# Patient Record
Sex: Male | Born: 1945
Health system: Southern US, Community
[De-identification: ages and names within clinical notes are randomized; demographics above are authoritative.]

## PROBLEM LIST (undated history)

## (undated) DIAGNOSIS — J449 Chronic obstructive pulmonary disease, unspecified: Secondary | ICD-10-CM

## (undated) DIAGNOSIS — J439 Emphysema, unspecified: Secondary | ICD-10-CM

## (undated) HISTORY — DX: Chronic obstructive pulmonary disease, unspecified: J44.9

## (undated) HISTORY — DX: Emphysema, unspecified: J43.9

## (undated) HISTORY — PX: SHOULDER SURGERY: SHX246

---

## 2001-08-29 ENCOUNTER — Encounter: Admission: RE | Admit: 2001-08-29 | Discharge: 2001-08-29 | Payer: Self-pay | Admitting: Neurosurgery

## 2001-08-29 ENCOUNTER — Encounter: Payer: Self-pay | Admitting: Neurosurgery

## 2001-09-12 ENCOUNTER — Encounter: Payer: Self-pay | Admitting: Neurosurgery

## 2001-09-12 ENCOUNTER — Encounter: Admission: RE | Admit: 2001-09-12 | Discharge: 2001-09-12 | Payer: Self-pay | Admitting: Neurosurgery

## 2001-09-26 ENCOUNTER — Encounter: Payer: Self-pay | Admitting: Neurosurgery

## 2001-09-26 ENCOUNTER — Encounter: Admission: RE | Admit: 2001-09-26 | Discharge: 2001-09-26 | Payer: Self-pay | Admitting: Neurosurgery

## 2004-09-30 ENCOUNTER — Ambulatory Visit: Payer: Self-pay | Admitting: Cardiology

## 2004-10-02 ENCOUNTER — Inpatient Hospital Stay (HOSPITAL_COMMUNITY): Admission: AD | Admit: 2004-10-02 | Discharge: 2004-10-02 | Payer: Self-pay | Admitting: Cardiology

## 2004-10-02 ENCOUNTER — Ambulatory Visit: Payer: Self-pay | Admitting: Internal Medicine

## 2004-10-15 ENCOUNTER — Ambulatory Visit: Payer: Self-pay | Admitting: Internal Medicine

## 2004-12-11 ENCOUNTER — Ambulatory Visit: Payer: Self-pay | Admitting: *Deleted

## 2004-12-25 ENCOUNTER — Ambulatory Visit: Payer: Self-pay | Admitting: *Deleted

## 2007-12-26 ENCOUNTER — Ambulatory Visit (HOSPITAL_COMMUNITY): Admission: RE | Admit: 2007-12-26 | Discharge: 2007-12-26 | Payer: Self-pay | Admitting: Orthopedic Surgery

## 2010-06-16 NOTE — Op Note (Signed)
NAMEAZAREL, BANNER            ACCOUNT NO.:  [id number]   MEDICAL RECORD NO.:  [id number]          PATIENT TYPE:  AMB   LOCATION:  SDS                          FACILITY:  [facility]   PHYSICIAN:  Rodney A. Mortenson, M.D.DATE OF BIRTH:  [birth year]   DATE OF PROCEDURE:  DATE OF DISCHARGE:                               OPERATIVE REPORT   JUSTIFICATION:  A 65 year old male with long history of right shoulder  pain, had an MRI and it shows advanced degenerative arthrosis of the  right AC joint with inferior projecting bone spurs and a type 2 curved  anterior acromion causing severe impingement.  There is partial tear of  the rotator cuff, no full-thickness tear was noted.  The patient now  admitted for an arthroscopic evaluation of shoulder and treatment.  Complications discussed preoperatively.  All questions answered and  encouraged and the patient wished to proceed.   Justification outpatient surgical and minimal morbidity.   PREOPERATIVE DIAGNOSES:  Partial articular surface tear, rotator cuff  right shoulder; impingement syndrome with inferior bone spurs of  acromion and advanced arthritis, right acromioclavicular joint.   POSTOPERATIVE DIAGNOSES:  Partial articular surface tear, rotator cuff  right shoulder; impingement syndrome with inferior bone spurs of  acromion and advanced arthritis, right acromioclavicular joint.   OPERATION:  Arthroscopic evaluation, right shoulder; debridement  articular surface partial tear, rotator cuff; subacromial decompression  and distal clavicle resection through the arthroscope.   SURGEON:  Lenard Galloway. Chaney Malling, MD   ASSISTANT:  Oris Drone. Petrarca, PA-C   ANESTHESIA:  General.   DESCRIPTION OF PROCEDURE:  The patient was placed on the table in supine  position.  After satisfactory general anesthesia, the patient was placed  in semi-sitting position.  The right upper extremity and shoulder was  prepped with DuraPrep and draped out in the usual  manner.  The  arthroscope was introduced into the posterior portal and anterior  operative portal was made.  The glenohumeral joint was visualized.  The  articular cartilage over the humeral head and glenoid appeared normal as  did the entire circumference of the labrum.  The biceps was intact.  The  inferior surface of the rotator cuff was frayed and torn, and through an  anterior portal, ArthroCare wand was inserted and frayed and torn  portions of the rotator cuff were debrided.  Once this was accomplished  to my satisfaction, the arthroscope was then placed in the subacromial  space.  No full-thickness tear was seen.  A partial bursectomy was then  done.  The inferior bone spurs on the acromion.  Through a lateral  portal, a bur was inserted.  A fairly generous acromioplasty was done  and inferior bone spurs were removed.  Throughout the ArthroCare wand  was used to control bleeding and also to debride soft tissue to make  access to the bony surfaces more radically adequate.  The [facility] joint was  then identified with an 18-gauge spinal needle through the [facility] joint.  The undersurface of the clavicle was then debrided with the high-speed  bur.  Through an anterior portal, the bur was inserted at the  level of  the [facility] joint and the acromion of the [facility] joint was debrided and then the  bur was placed in the [facility] joint itself.  Distal end of the clavicle was  then resected.  This was done very carefully and meticulously.  All bone  spurs were removed.  Excellent decompression was achieved.  Good  hemostasis was achieved throughout the procedure.  I was very pleased  with final outcome and decompression that was accomplished.  Drains  none.  Complications none.  Two sutures were placed in 2 portals and  sterile dressing applied.  The patient returned to the recovery room in  excellent condition.  Technically this went extremely well.   DISPOSITION:  1. He may wear a sling and after 10 days does not  need a sling.  2. Percocet for pain.  3. To my office in [place] in a week on Friday.  4. Usual postop instructions.      Rodney A. Chaney Malling, M.D.  Electronically Signed     RAM/MEDQ  D:  12/26/2007  T:  12/27/2007  Job:  [id number]   cc:   Donzetta Sprung

## 2010-06-19 NOTE — Discharge Summary (Signed)
[name], [name]            ACCOUNT NO.:  [id number]   MEDICAL RECORD NO.:  [id number]          PATIENT TYPE:  INP   LOCATION:  6524                         FACILITY:  [facility]   PHYSICIAN:  Arvilla Meres, M.D. LHCDATE OF BIRTH:  1945-04-24   DATE OF ADMISSION:  10/02/2004  DATE OF DISCHARGE:  10/02/2004                                 DISCHARGE SUMMARY   PROCEDURES:  1.  Cardiac catheterization.  2.  Coronary arteriogram.  3.  Left ventriculogram.   DISCHARGE DIAGNOSES:  1.  Chest pain, status post catheterization this admission showing minimal      nonobstructive coronary artery disease and a muscle bridge in the mid      LAD of approximately 30% that was not felt to be flow limiting.  2.  Preserved left ventricular function with an EF of 60% and possible very      mild anterior hypokinesis.  3.  Intolerance or allergy to ULTRAM, NEURONTIN, AND LIPITOR.  4.  Remote history of tobacco use.  5.  Hypertension.  6.  Dyslipidemia.  7.  History of peripheral vascular disease, status post left femoral bypass      secondary to Leriche syndrome  8.  Emphysema.   HOSPITAL COURSE:  [name] is a 65 year old male with no known history of  coronary artery disease.  He quit smoking several years ago. He had an  ischemic workup in 1994 with a negative Cardiolite.  He went to [facility], on September 29, 2004, for chest pain.  He was evaluated there by Dr.  Reuel Boom and referred to cardiology.   [name] was evaluated there by Dr. Andee Lineman.  Dr. Andee Lineman was concerned  because of multiple cardiac risk factors, although his chest pain was  atypical, he felt that catheterization was indicated.  He was transferred to  [facility] for this on October 02, 2004.   The cardiac catheterization showed nonobstructive coronary artery disease  with a 20% circumflex, a 30% RCA, and a 30% stenosis in the LAD created by  muscle bridge.  Dr. Gala Romney evaluated the films and felt  that this was not  flow limiting.  He had potential for a mild anterior hypokinesis but his EF  was 60% with no other wall motion abnormalities noted.   [name] was done as an arm case because of his peripheral vascular  disease.  Post cath, his site was without ecchymosis, oozing, or hematoma.  Dr. Gala Romney evaluated [name] and considered him stable for discharge,  on October 02, 2004, with outpatient followup arranged.   DISCHARGE INSTRUCTIONS:  1.  His activity level is to be per the cath discharge sheet.  2.  A sling will be used to help him remember to keep his arm still as the      right brachial was used and he is right-handed.  3.  He is to stick to a low-fat diet.  4.  He is to follow up with Suszanne Conners. Julious Oka. for Dr. Andee Lineman on      October 15, 2004 at 10:45 a.m.  5.  He is  to follow up with Dr. Reuel Boom as needed.   DISCHARGE MEDICATIONS:  1.  Hydrocodone/Lorcet as prior to admission.  2.  Aspirin 81 mg every day.  3.  Micardis HCT 80/25 mg every day.  4.  Trazodone as prior to admission.      Theodore Demark, P.A. [facility]      Arvilla Meres, M.D. [facility]  Electronically Signed    RB/MEDQ  D:  10/02/2004  T:  10/03/2004  Job:  [id number]   cc:   Reuel Boom, Dr.  [facility]  [place], [place]

## 2010-06-19 NOTE — Cardiovascular Report (Signed)
NAMEBENTLY, WYSS NO.:  [id number]   MEDICAL RECORD NO.:  [id number]          PATIENT TYPE:  INP   LOCATION:  6524                         [facility]AC[facility]  [facility]   PHYSICIAN:  Arvilla Meres, M.D. [facility] OF BIRTH:  12-15-1945   DATE OF PROCEDURE:  10/02/2004  DATE OF DISCHARGE:                              CARDIAC CATHETERIZATION   PRIMARY CARE PHYSICIAN:  Dr. Donzetta Sprung in [place], [place].   CARDIOLOGIST:  Dr. Andee Lineman.   PATIENT IDENTIFICATION:  Mr. Micucci is a 65 year old male with a history of  peripheral vascular disease status post aortobifemoral grafting as well as  hypertension and hyperlipidemia. He was admitted to [facility] with  some atypical chest pain as well as palpitations. He ruled out for  myocardial infarction. He underwent adenosine Cardiolite which showed  multiple perfusion defects in the inferior wall as well as the anterior  septum and thus was referred for cardiac catheterization. Given his previous  aortobifemoral bypass and the complications he has had with it, it was  requested that he be done through a right brachial approach.   PROCEDURES PERFORMED:  1.  Selective coronary angiography.  2.  Left heart catheterization.  3.  Left ventriculogram.   DESCRIPTION OF PROCEDURE:  The risks and benefits of the catheterization  were explained to Mr. Emma; consent was signed and placed on the chart. A  6-French arterial sheath was placed in the right brachial artery after a  normal Allen's test. Once the sheath was inserted, the patient was given  3,000 of IV heparin, and the sheath was sewn in. A Castillo II catheter was  used to image the left coronary system. The right coronary artery had an  anterior takeoff, and we used the Spry I catheter to engage. All  catheter exchanges were made over a long wire. There were no apparent  complications. At the end the procedure, the patient was transferred in the  holding area  for removal of his sheath.   FINDINGS:  Central aortic pressure is 105/64 with a mean of 81. LV pressure  was 102/0 with an LVEDP of 5. There was no gradient on aortic valve  pullback.   Left main was normal.   LAD was a long vessel that coursed to the apex. It gave off a large first  diagonal and a small second diagonal. There was evidence of muscle bridge in  the mid to distal LAD just after the takeoff of the second diagonal with  about 30-40% luminal narrowing during systole. There is no flow-limiting  disease there. Distal flow was not compromised.   The left circumflex is a moderate size vessel. It gave off a small ramus, a  small OM-1 and a small OM-2, and a large OM-3. There is a 20% tubular  stenosis in the proximal portion of the OM-3.   Right coronary artery was a large dominant vessel that gave off a large PDA  and two small to moderate-sized PLs. There is a tubular irregularity in the  proximal portion and 30% tubular lesion in the mid portion.   Left ventriculogram done in RAO  approach showed an EF of 60%. There was a  question  of perhaps very mild anterior hypokinesis which may have been  related to some ectopy.   ASSESSMENT:  1.  Minimal nonobstructive coronary disease.  2.  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  3.  Normal left ventricular function with a question of very mild anterior      hypokinesis possibly related to ectopy.   PLAN:  1.  Medical therapy for his coronary disease.  2.  He can likely be discharged home tonight if his catheter site is stable.      Arvilla Meres, M.D. [facility]  Electronically Signed     DB/MEDQ  D:  10/02/2004  T:  10/03/2004  Job:  [id number]   cc:   Donzetta Sprung  [address]  [place]  [place] [address]  Fax: [phone]   Learta Codding, M.D. [facility]  [address]  [address]  [place]  [place] [address]

## 2010-11-03 LAB — COMPREHENSIVE METABOLIC PANEL
ALT: 27
AST: 26
Albumin: 4.1
Alkaline Phosphatase: 84
BUN: 12
CO2: 28
Calcium: 9.5
Chloride: 102
Creatinine, Ser: 0.81
GFR calc Af Amer: >60
GFR calc non Af Amer: >60
Glucose, Bld: 111 — ABNORMAL HIGH
Potassium: 4.3
Sodium: 138
Total Bilirubin: 0.8
Total Protein: 7.4

## 2010-11-03 LAB — CBC
HCT: 45.1
Hemoglobin: 15.3
MCHC: 33.8
MCHC: 33.9
MCV: 98.1
Platelets: 260
RBC: 4.59
RDW: 12.6
RDW: 12.8
WBC: 5.2

## 2010-11-03 LAB — URINALYSIS, ROUTINE W REFLEX MICROSCOPIC
Bilirubin Urine: NEGATIVE
Ketones, ur: NEGATIVE
Nitrite: NEGATIVE
Urobilinogen, UA: 1

## 2010-11-03 LAB — DIFFERENTIAL
Basophils Absolute: 0.1
Basophils Relative: 1
Neutro Abs: 1.7
Neutrophils Relative %: 35 — ABNORMAL LOW

## 2010-11-03 LAB — PROTIME-INR
INR: 1
Prothrombin Time: 13.2

## 2010-11-03 LAB — APTT: aPTT: 32

## 2012-03-28 DIAGNOSIS — E782 Mixed hyperlipidemia: Secondary | ICD-10-CM | POA: Diagnosis not present

## 2012-03-28 DIAGNOSIS — N400 Enlarged prostate without lower urinary tract symptoms: Secondary | ICD-10-CM | POA: Diagnosis not present

## 2012-03-28 DIAGNOSIS — I10 Essential (primary) hypertension: Secondary | ICD-10-CM | POA: Diagnosis not present

## 2012-04-04 DIAGNOSIS — I10 Essential (primary) hypertension: Secondary | ICD-10-CM | POA: Diagnosis not present

## 2012-04-04 DIAGNOSIS — M545 Low back pain: Secondary | ICD-10-CM | POA: Diagnosis not present

## 2012-04-04 DIAGNOSIS — J449 Chronic obstructive pulmonary disease, unspecified: Secondary | ICD-10-CM | POA: Diagnosis not present

## 2012-04-04 DIAGNOSIS — C61 Malignant neoplasm of prostate: Secondary | ICD-10-CM | POA: Diagnosis not present

## 2012-04-04 DIAGNOSIS — I739 Peripheral vascular disease, unspecified: Secondary | ICD-10-CM | POA: Diagnosis not present

## 2012-04-04 DIAGNOSIS — J309 Allergic rhinitis, unspecified: Secondary | ICD-10-CM | POA: Diagnosis not present

## 2012-04-04 DIAGNOSIS — M1990 Unspecified osteoarthritis, unspecified site: Secondary | ICD-10-CM | POA: Diagnosis not present

## 2012-04-04 DIAGNOSIS — E782 Mixed hyperlipidemia: Secondary | ICD-10-CM | POA: Diagnosis not present

## 2012-05-11 DIAGNOSIS — M1990 Unspecified osteoarthritis, unspecified site: Secondary | ICD-10-CM | POA: Diagnosis not present

## 2012-05-11 DIAGNOSIS — I739 Peripheral vascular disease, unspecified: Secondary | ICD-10-CM | POA: Diagnosis not present

## 2012-05-11 DIAGNOSIS — J449 Chronic obstructive pulmonary disease, unspecified: Secondary | ICD-10-CM | POA: Diagnosis not present

## 2012-05-11 DIAGNOSIS — M545 Low back pain: Secondary | ICD-10-CM | POA: Diagnosis not present

## 2012-05-11 DIAGNOSIS — I10 Essential (primary) hypertension: Secondary | ICD-10-CM | POA: Diagnosis not present

## 2012-05-11 DIAGNOSIS — E782 Mixed hyperlipidemia: Secondary | ICD-10-CM | POA: Diagnosis not present

## 2012-05-11 DIAGNOSIS — J309 Allergic rhinitis, unspecified: Secondary | ICD-10-CM | POA: Diagnosis not present

## 2012-05-11 DIAGNOSIS — C61 Malignant neoplasm of prostate: Secondary | ICD-10-CM | POA: Diagnosis not present

## 2012-09-12 DIAGNOSIS — C61 Malignant neoplasm of prostate: Secondary | ICD-10-CM | POA: Diagnosis not present

## 2012-09-12 DIAGNOSIS — E782 Mixed hyperlipidemia: Secondary | ICD-10-CM | POA: Diagnosis not present

## 2012-09-12 DIAGNOSIS — E039 Hypothyroidism, unspecified: Secondary | ICD-10-CM | POA: Diagnosis not present

## 2012-09-19 DIAGNOSIS — E782 Mixed hyperlipidemia: Secondary | ICD-10-CM | POA: Diagnosis not present

## 2012-09-19 DIAGNOSIS — I739 Peripheral vascular disease, unspecified: Secondary | ICD-10-CM | POA: Diagnosis not present

## 2012-09-19 DIAGNOSIS — I10 Essential (primary) hypertension: Secondary | ICD-10-CM | POA: Diagnosis not present

## 2012-09-19 DIAGNOSIS — J309 Allergic rhinitis, unspecified: Secondary | ICD-10-CM | POA: Diagnosis not present

## 2012-09-19 DIAGNOSIS — M545 Low back pain: Secondary | ICD-10-CM | POA: Diagnosis not present

## 2012-09-19 DIAGNOSIS — M1990 Unspecified osteoarthritis, unspecified site: Secondary | ICD-10-CM | POA: Diagnosis not present

## 2012-09-19 DIAGNOSIS — J449 Chronic obstructive pulmonary disease, unspecified: Secondary | ICD-10-CM | POA: Diagnosis not present

## 2012-09-19 DIAGNOSIS — C61 Malignant neoplasm of prostate: Secondary | ICD-10-CM | POA: Diagnosis not present

## 2012-11-07 DIAGNOSIS — I739 Peripheral vascular disease, unspecified: Secondary | ICD-10-CM | POA: Diagnosis not present

## 2012-11-07 DIAGNOSIS — M545 Low back pain: Secondary | ICD-10-CM | POA: Diagnosis not present

## 2012-11-07 DIAGNOSIS — C61 Malignant neoplasm of prostate: Secondary | ICD-10-CM | POA: Diagnosis not present

## 2012-11-07 DIAGNOSIS — J309 Allergic rhinitis, unspecified: Secondary | ICD-10-CM | POA: Diagnosis not present

## 2012-11-07 DIAGNOSIS — E782 Mixed hyperlipidemia: Secondary | ICD-10-CM | POA: Diagnosis not present

## 2012-11-07 DIAGNOSIS — I10 Essential (primary) hypertension: Secondary | ICD-10-CM | POA: Diagnosis not present

## 2012-11-07 DIAGNOSIS — Z23 Encounter for immunization: Secondary | ICD-10-CM | POA: Diagnosis not present

## 2012-11-07 DIAGNOSIS — M1990 Unspecified osteoarthritis, unspecified site: Secondary | ICD-10-CM | POA: Diagnosis not present

## 2012-11-24 DIAGNOSIS — J449 Chronic obstructive pulmonary disease, unspecified: Secondary | ICD-10-CM | POA: Diagnosis not present

## 2012-11-24 DIAGNOSIS — J209 Acute bronchitis, unspecified: Secondary | ICD-10-CM | POA: Diagnosis not present

## 2012-11-24 DIAGNOSIS — R05 Cough: Secondary | ICD-10-CM | POA: Diagnosis not present

## 2012-11-24 DIAGNOSIS — R0609 Other forms of dyspnea: Secondary | ICD-10-CM | POA: Diagnosis not present

## 2013-01-15 DIAGNOSIS — I739 Peripheral vascular disease, unspecified: Secondary | ICD-10-CM | POA: Diagnosis present

## 2013-01-15 DIAGNOSIS — Z9981 Dependence on supplemental oxygen: Secondary | ICD-10-CM | POA: Diagnosis not present

## 2013-01-15 DIAGNOSIS — G4733 Obstructive sleep apnea (adult) (pediatric): Secondary | ICD-10-CM | POA: Diagnosis present

## 2013-01-15 DIAGNOSIS — R0602 Shortness of breath: Secondary | ICD-10-CM | POA: Diagnosis not present

## 2013-01-15 DIAGNOSIS — J9600 Acute respiratory failure, unspecified whether with hypoxia or hypercapnia: Secondary | ICD-10-CM | POA: Diagnosis not present

## 2013-01-15 DIAGNOSIS — Z79899 Other long term (current) drug therapy: Secondary | ICD-10-CM | POA: Diagnosis not present

## 2013-01-15 DIAGNOSIS — G8929 Other chronic pain: Secondary | ICD-10-CM | POA: Diagnosis present

## 2013-01-15 DIAGNOSIS — G609 Hereditary and idiopathic neuropathy, unspecified: Secondary | ICD-10-CM | POA: Diagnosis present

## 2013-01-15 DIAGNOSIS — C61 Malignant neoplasm of prostate: Secondary | ICD-10-CM | POA: Diagnosis present

## 2013-01-15 DIAGNOSIS — R918 Other nonspecific abnormal finding of lung field: Secondary | ICD-10-CM | POA: Diagnosis not present

## 2013-01-15 DIAGNOSIS — J441 Chronic obstructive pulmonary disease with (acute) exacerbation: Secondary | ICD-10-CM | POA: Diagnosis not present

## 2013-01-15 DIAGNOSIS — Z7982 Long term (current) use of aspirin: Secondary | ICD-10-CM | POA: Diagnosis not present

## 2013-01-15 DIAGNOSIS — Z87891 Personal history of nicotine dependence: Secondary | ICD-10-CM | POA: Diagnosis not present

## 2013-01-15 DIAGNOSIS — Z888 Allergy status to other drugs, medicaments and biological substances status: Secondary | ICD-10-CM | POA: Diagnosis not present

## 2013-01-15 DIAGNOSIS — R0609 Other forms of dyspnea: Secondary | ICD-10-CM | POA: Diagnosis not present

## 2013-01-15 DIAGNOSIS — J9620 Acute and chronic respiratory failure, unspecified whether with hypoxia or hypercapnia: Secondary | ICD-10-CM | POA: Diagnosis present

## 2013-01-15 DIAGNOSIS — R0902 Hypoxemia: Secondary | ICD-10-CM | POA: Diagnosis not present

## 2013-01-15 DIAGNOSIS — F329 Major depressive disorder, single episode, unspecified: Secondary | ICD-10-CM | POA: Diagnosis present

## 2013-01-15 DIAGNOSIS — F410 Panic disorder [episodic paroxysmal anxiety] without agoraphobia: Secondary | ICD-10-CM | POA: Diagnosis present

## 2013-02-15 DIAGNOSIS — M545 Low back pain, unspecified: Secondary | ICD-10-CM | POA: Diagnosis not present

## 2013-02-15 DIAGNOSIS — E782 Mixed hyperlipidemia: Secondary | ICD-10-CM | POA: Diagnosis not present

## 2013-02-15 DIAGNOSIS — M1990 Unspecified osteoarthritis, unspecified site: Secondary | ICD-10-CM | POA: Diagnosis not present

## 2013-02-15 DIAGNOSIS — C61 Malignant neoplasm of prostate: Secondary | ICD-10-CM | POA: Diagnosis not present

## 2013-02-15 DIAGNOSIS — I739 Peripheral vascular disease, unspecified: Secondary | ICD-10-CM | POA: Diagnosis not present

## 2013-02-15 DIAGNOSIS — Z1331 Encounter for screening for depression: Secondary | ICD-10-CM | POA: Diagnosis not present

## 2013-02-15 DIAGNOSIS — J309 Allergic rhinitis, unspecified: Secondary | ICD-10-CM | POA: Diagnosis not present

## 2013-02-15 DIAGNOSIS — I10 Essential (primary) hypertension: Secondary | ICD-10-CM | POA: Diagnosis not present

## 2013-02-21 DIAGNOSIS — E782 Mixed hyperlipidemia: Secondary | ICD-10-CM | POA: Diagnosis not present

## 2013-02-21 DIAGNOSIS — M1990 Unspecified osteoarthritis, unspecified site: Secondary | ICD-10-CM | POA: Diagnosis not present

## 2013-02-21 DIAGNOSIS — M545 Low back pain, unspecified: Secondary | ICD-10-CM | POA: Diagnosis not present

## 2013-02-21 DIAGNOSIS — J449 Chronic obstructive pulmonary disease, unspecified: Secondary | ICD-10-CM | POA: Diagnosis not present

## 2013-02-21 DIAGNOSIS — J309 Allergic rhinitis, unspecified: Secondary | ICD-10-CM | POA: Diagnosis not present

## 2013-02-21 DIAGNOSIS — I10 Essential (primary) hypertension: Secondary | ICD-10-CM | POA: Diagnosis not present

## 2013-02-21 DIAGNOSIS — C61 Malignant neoplasm of prostate: Secondary | ICD-10-CM | POA: Diagnosis not present

## 2013-02-21 DIAGNOSIS — I739 Peripheral vascular disease, unspecified: Secondary | ICD-10-CM | POA: Diagnosis not present

## 2013-05-09 DIAGNOSIS — J209 Acute bronchitis, unspecified: Secondary | ICD-10-CM | POA: Diagnosis not present

## 2013-05-09 DIAGNOSIS — J449 Chronic obstructive pulmonary disease, unspecified: Secondary | ICD-10-CM | POA: Diagnosis not present

## 2013-05-14 DIAGNOSIS — I739 Peripheral vascular disease, unspecified: Secondary | ICD-10-CM | POA: Diagnosis not present

## 2013-05-14 DIAGNOSIS — G619 Inflammatory polyneuropathy, unspecified: Secondary | ICD-10-CM | POA: Diagnosis not present

## 2013-05-14 DIAGNOSIS — E039 Hypothyroidism, unspecified: Secondary | ICD-10-CM | POA: Diagnosis not present

## 2013-05-14 DIAGNOSIS — I10 Essential (primary) hypertension: Secondary | ICD-10-CM | POA: Diagnosis not present

## 2013-05-14 DIAGNOSIS — C61 Malignant neoplasm of prostate: Secondary | ICD-10-CM | POA: Diagnosis not present

## 2013-05-14 DIAGNOSIS — M1990 Unspecified osteoarthritis, unspecified site: Secondary | ICD-10-CM | POA: Diagnosis not present

## 2013-05-14 DIAGNOSIS — E782 Mixed hyperlipidemia: Secondary | ICD-10-CM | POA: Diagnosis not present

## 2013-05-21 DIAGNOSIS — C61 Malignant neoplasm of prostate: Secondary | ICD-10-CM | POA: Diagnosis not present

## 2013-05-21 DIAGNOSIS — I739 Peripheral vascular disease, unspecified: Secondary | ICD-10-CM | POA: Diagnosis not present

## 2013-05-21 DIAGNOSIS — M1990 Unspecified osteoarthritis, unspecified site: Secondary | ICD-10-CM | POA: Diagnosis not present

## 2013-05-21 DIAGNOSIS — J309 Allergic rhinitis, unspecified: Secondary | ICD-10-CM | POA: Diagnosis not present

## 2013-05-21 DIAGNOSIS — I10 Essential (primary) hypertension: Secondary | ICD-10-CM | POA: Diagnosis not present

## 2013-05-21 DIAGNOSIS — J449 Chronic obstructive pulmonary disease, unspecified: Secondary | ICD-10-CM | POA: Diagnosis not present

## 2013-05-21 DIAGNOSIS — M545 Low back pain, unspecified: Secondary | ICD-10-CM | POA: Diagnosis not present

## 2013-05-21 DIAGNOSIS — E782 Mixed hyperlipidemia: Secondary | ICD-10-CM | POA: Diagnosis not present

## 2013-07-20 DIAGNOSIS — J209 Acute bronchitis, unspecified: Secondary | ICD-10-CM | POA: Diagnosis not present

## 2013-07-20 DIAGNOSIS — J019 Acute sinusitis, unspecified: Secondary | ICD-10-CM | POA: Diagnosis not present

## 2013-07-20 DIAGNOSIS — J449 Chronic obstructive pulmonary disease, unspecified: Secondary | ICD-10-CM | POA: Diagnosis not present

## 2013-08-21 DIAGNOSIS — M1990 Unspecified osteoarthritis, unspecified site: Secondary | ICD-10-CM | POA: Diagnosis not present

## 2013-08-21 DIAGNOSIS — I739 Peripheral vascular disease, unspecified: Secondary | ICD-10-CM | POA: Diagnosis not present

## 2013-08-21 DIAGNOSIS — M545 Low back pain, unspecified: Secondary | ICD-10-CM | POA: Diagnosis not present

## 2013-08-21 DIAGNOSIS — J309 Allergic rhinitis, unspecified: Secondary | ICD-10-CM | POA: Diagnosis not present

## 2013-08-21 DIAGNOSIS — C61 Malignant neoplasm of prostate: Secondary | ICD-10-CM | POA: Diagnosis not present

## 2013-08-21 DIAGNOSIS — I10 Essential (primary) hypertension: Secondary | ICD-10-CM | POA: Diagnosis not present

## 2013-08-21 DIAGNOSIS — J449 Chronic obstructive pulmonary disease, unspecified: Secondary | ICD-10-CM | POA: Diagnosis not present

## 2013-08-21 DIAGNOSIS — E782 Mixed hyperlipidemia: Secondary | ICD-10-CM | POA: Diagnosis not present

## 2013-10-01 DIAGNOSIS — M949 Disorder of cartilage, unspecified: Secondary | ICD-10-CM | POA: Diagnosis not present

## 2013-10-01 DIAGNOSIS — M899 Disorder of bone, unspecified: Secondary | ICD-10-CM | POA: Diagnosis not present

## 2013-10-01 DIAGNOSIS — Z13820 Encounter for screening for osteoporosis: Secondary | ICD-10-CM | POA: Diagnosis not present

## 2013-11-20 DIAGNOSIS — E784 Other hyperlipidemia: Secondary | ICD-10-CM | POA: Diagnosis not present

## 2013-11-20 DIAGNOSIS — I739 Peripheral vascular disease, unspecified: Secondary | ICD-10-CM | POA: Diagnosis not present

## 2013-11-20 DIAGNOSIS — C61 Malignant neoplasm of prostate: Secondary | ICD-10-CM | POA: Diagnosis not present

## 2013-11-20 DIAGNOSIS — Z23 Encounter for immunization: Secondary | ICD-10-CM | POA: Diagnosis not present

## 2013-11-20 DIAGNOSIS — I10 Essential (primary) hypertension: Secondary | ICD-10-CM | POA: Diagnosis not present

## 2013-11-20 DIAGNOSIS — Z9189 Other specified personal risk factors, not elsewhere classified: Secondary | ICD-10-CM | POA: Diagnosis not present

## 2013-11-20 DIAGNOSIS — E038 Other specified hypothyroidism: Secondary | ICD-10-CM | POA: Diagnosis not present

## 2013-11-20 DIAGNOSIS — J301 Allergic rhinitis due to pollen: Secondary | ICD-10-CM | POA: Diagnosis not present

## 2014-03-01 DIAGNOSIS — J13 Pneumonia due to Streptococcus pneumoniae: Secondary | ICD-10-CM | POA: Diagnosis not present

## 2014-03-01 DIAGNOSIS — R509 Fever, unspecified: Secondary | ICD-10-CM | POA: Diagnosis not present

## 2014-03-01 DIAGNOSIS — J449 Chronic obstructive pulmonary disease, unspecified: Secondary | ICD-10-CM | POA: Diagnosis not present

## 2014-03-01 DIAGNOSIS — R05 Cough: Secondary | ICD-10-CM | POA: Diagnosis not present

## 2014-03-19 DIAGNOSIS — E784 Other hyperlipidemia: Secondary | ICD-10-CM | POA: Diagnosis not present

## 2014-03-19 DIAGNOSIS — R05 Cough: Secondary | ICD-10-CM | POA: Diagnosis not present

## 2014-03-19 DIAGNOSIS — N400 Enlarged prostate without lower urinary tract symptoms: Secondary | ICD-10-CM | POA: Diagnosis not present

## 2014-03-19 DIAGNOSIS — R0600 Dyspnea, unspecified: Secondary | ICD-10-CM | POA: Diagnosis not present

## 2014-03-19 DIAGNOSIS — I10 Essential (primary) hypertension: Secondary | ICD-10-CM | POA: Diagnosis not present

## 2014-03-19 DIAGNOSIS — E038 Other specified hypothyroidism: Secondary | ICD-10-CM | POA: Diagnosis not present

## 2014-03-25 DIAGNOSIS — M545 Low back pain: Secondary | ICD-10-CM | POA: Diagnosis not present

## 2014-03-25 DIAGNOSIS — I739 Peripheral vascular disease, unspecified: Secondary | ICD-10-CM | POA: Diagnosis not present

## 2014-03-25 DIAGNOSIS — I10 Essential (primary) hypertension: Secondary | ICD-10-CM | POA: Diagnosis not present

## 2014-03-25 DIAGNOSIS — J449 Chronic obstructive pulmonary disease, unspecified: Secondary | ICD-10-CM | POA: Diagnosis not present

## 2014-03-25 DIAGNOSIS — G6289 Other specified polyneuropathies: Secondary | ICD-10-CM | POA: Diagnosis not present

## 2014-03-25 DIAGNOSIS — Z0001 Encounter for general adult medical examination with abnormal findings: Secondary | ICD-10-CM | POA: Diagnosis not present

## 2014-03-25 DIAGNOSIS — J301 Allergic rhinitis due to pollen: Secondary | ICD-10-CM | POA: Diagnosis not present

## 2014-03-25 DIAGNOSIS — C61 Malignant neoplasm of prostate: Secondary | ICD-10-CM | POA: Diagnosis not present

## 2014-03-25 DIAGNOSIS — Z1389 Encounter for screening for other disorder: Secondary | ICD-10-CM | POA: Diagnosis not present

## 2014-03-25 DIAGNOSIS — E784 Other hyperlipidemia: Secondary | ICD-10-CM | POA: Diagnosis not present

## 2014-03-25 DIAGNOSIS — Z9189 Other specified personal risk factors, not elsewhere classified: Secondary | ICD-10-CM | POA: Diagnosis not present

## 2014-07-04 NOTE — Patient Outreach (Signed)
[facility]) Care Management  07/04/2014  George Andersen November 16, 1945 [id number]   Referral from MD, assigned Theadore Nan, LCSW.  [name]. [place], [place] Management [place] Assistant Phone: [phone] Fax: [phone]

## 2014-07-11 ENCOUNTER — Other Ambulatory Visit: Payer: Self-pay | Admitting: Licensed Clinical Social Worker

## 2014-07-11 NOTE — Patient Outreach (Signed)
  Assessment:  CSW received referral on George Andersen.  CSW completed chart review on client on 07/11/14.  CSW called home phone number of client on 07/11/14.  CSW poke via phone with client on 07/11/14. CSW verified identity of client. CSW introduced self and informed client that Dr. Quillian Quince, primary doctor for client, had referred client to [facility] program on 06/27/14.  Client gave CSW verbal permission to talk with client about current medical needs of client. Client said he takes pain medications as prescribed.  He said he does well walking around his home.  He does not use a cane or walker. He said he eats well and has good appetite.  He said he thinks his weight has remained stable fo rthe past few months. Client has ongoing pain issues. Pain area is in back and legs.  He takes pain medications as prescribed.  CSW and client spoke of client's use of psychotropic medications as prescribed (xanax and celexa). Client said he takes his psychotropic medications as prescribed. CSW discussed with client that client could talk further with Dr. Quillian Quince about counseling support services in [place], [place] for client. CSW informed client regarding 3 counseling support services in [place], [place]:  [facility] agency, [facility], learning disability, and [employer].  Again, CSW encouraged client to speak further with Dr. Quillian Quince regarding counseling support options for client.  CSW and client completed needed [facility] assessments for client.  CSW talked with client about [facility] consent form completion.  Client agreed for [facility] consent form to be mailed to him for his review.  CSW gave client CSW [facility] phone number of [phone] and invited client to call CSW as needed for clinical social work support.  CSW thanked client for phone call on 07/11/14.  Plan: Client to take medications as prescribed and attend scheduled medical appointments. Freda Jackson to mail client [facility] consent form and return envelope for  client review. CSW to call client in two weeks to assess needs of client.  [name].George Andersen MSW, LCSW Licensed Clinical Social Worker [facility] Care Management [phone]

## 2014-07-31 DIAGNOSIS — N400 Enlarged prostate without lower urinary tract symptoms: Secondary | ICD-10-CM | POA: Diagnosis not present

## 2014-08-08 DIAGNOSIS — J9611 Chronic respiratory failure with hypoxia: Secondary | ICD-10-CM | POA: Diagnosis not present

## 2014-08-08 DIAGNOSIS — E038 Other specified hypothyroidism: Secondary | ICD-10-CM | POA: Diagnosis not present

## 2014-08-08 DIAGNOSIS — G6289 Other specified polyneuropathies: Secondary | ICD-10-CM | POA: Diagnosis not present

## 2014-08-08 DIAGNOSIS — N400 Enlarged prostate without lower urinary tract symptoms: Secondary | ICD-10-CM | POA: Diagnosis not present

## 2014-08-08 DIAGNOSIS — I10 Essential (primary) hypertension: Secondary | ICD-10-CM | POA: Diagnosis not present

## 2014-08-08 DIAGNOSIS — M545 Low back pain: Secondary | ICD-10-CM | POA: Diagnosis not present

## 2014-08-08 DIAGNOSIS — C61 Malignant neoplasm of prostate: Secondary | ICD-10-CM | POA: Diagnosis not present

## 2014-08-08 DIAGNOSIS — E784 Other hyperlipidemia: Secondary | ICD-10-CM | POA: Diagnosis not present

## 2014-08-08 DIAGNOSIS — J449 Chronic obstructive pulmonary disease, unspecified: Secondary | ICD-10-CM | POA: Diagnosis not present

## 2014-08-08 DIAGNOSIS — I739 Peripheral vascular disease, unspecified: Secondary | ICD-10-CM | POA: Diagnosis not present

## 2014-09-10 ENCOUNTER — Other Ambulatory Visit: Payer: Self-pay | Admitting: Licensed Clinical Social Worker

## 2014-09-10 NOTE — Patient Outreach (Signed)
Assessment:  CSW completed chart review on client on 09/10/14.  CSW called home phone number of client on 09/10/14. CSW verified identity of client.  CSW spoke via phone with client on 09/10/14. CSW reminded client that since client saw Dr. Quillian Quince as client's primary care doctor, that there was no charge for client to receive [facility] program support. Client has support from his wife.  He is attending scheduled medical appointments. He has his prescribed medications. He said he uses a nebulizer about 4 times daily. He said he feels that nebulizer treatments are helpful to client.  He said he uses two prescribed medications, as needed, in nebulizer treatments.   He said he does not use inhaler. He said he walks well in the home. He uses oxygen continuously via nasal canula at 3 liters.  He said he has been on oxygen for about four years. He said he saw Dr. Quillian Quince about two weeks ago.  He said he has support from his spouse at home.  He said he can do well with activities of daily living. He said sometimes he gets short of breath.  He fatigues easily.  He said he feels that he gets nervous or shaky periodically.  He sometimes gets nervous, short of breath, sweating and cannot breath well.  He is taking prescribed psychotropic medications. He said he takes xanax as prescribed, celexa as prescribed, prednisone as prescribed. He said his medications can be expensive to purchase each month.  CSW spoke with client about counseling agencies in [place], [place].  [place] informed client about [facility], about [facility] [place] and about [employer].  CSW encouraged client to talk with Dr. Quillian Quince about mental health needs of client and to see if Dr. Quillian Quince wanted to make client referral to counseling agency in [place], [place] gave client the [place] number of [phone] and encouraged client to call CSW as needed to address social work needs of client.   Plan: Client to  take medications as prescribed and to attend scheduled medical appointments. Client to communicate with Dr. Quillian Quince as needed to discuss medical needs of client. Client to communicate with Dr. Quillian Quince to discuss mental health needs of client. CSW to call client in three weeks to assess needs of client at that time.   [name].Linn Clavin MSW, LCSW Licensed Clinical Social Worker [facility] Care Management [phone]

## 2014-10-16 DIAGNOSIS — C61 Malignant neoplasm of prostate: Secondary | ICD-10-CM | POA: Diagnosis not present

## 2014-10-16 DIAGNOSIS — N400 Enlarged prostate without lower urinary tract symptoms: Secondary | ICD-10-CM | POA: Diagnosis not present

## 2014-10-16 DIAGNOSIS — I739 Peripheral vascular disease, unspecified: Secondary | ICD-10-CM | POA: Diagnosis not present

## 2014-10-16 DIAGNOSIS — I10 Essential (primary) hypertension: Secondary | ICD-10-CM | POA: Diagnosis not present

## 2014-10-16 DIAGNOSIS — J449 Chronic obstructive pulmonary disease, unspecified: Secondary | ICD-10-CM | POA: Diagnosis not present

## 2014-10-16 DIAGNOSIS — J9611 Chronic respiratory failure with hypoxia: Secondary | ICD-10-CM | POA: Diagnosis not present

## 2014-10-16 DIAGNOSIS — Z23 Encounter for immunization: Secondary | ICD-10-CM | POA: Diagnosis not present

## 2014-10-16 DIAGNOSIS — E782 Mixed hyperlipidemia: Secondary | ICD-10-CM | POA: Diagnosis not present

## 2014-10-16 DIAGNOSIS — G6289 Other specified polyneuropathies: Secondary | ICD-10-CM | POA: Diagnosis not present

## 2014-10-18 DIAGNOSIS — R300 Dysuria: Secondary | ICD-10-CM | POA: Diagnosis not present

## 2014-10-22 DIAGNOSIS — M79604 Pain in right leg: Secondary | ICD-10-CM | POA: Diagnosis not present

## 2014-10-22 DIAGNOSIS — M79605 Pain in left leg: Secondary | ICD-10-CM | POA: Diagnosis not present

## 2014-10-22 DIAGNOSIS — M545 Low back pain: Secondary | ICD-10-CM | POA: Diagnosis not present

## 2014-10-22 DIAGNOSIS — M5126 Other intervertebral disc displacement, lumbar region: Secondary | ICD-10-CM | POA: Diagnosis not present

## 2014-11-29 DIAGNOSIS — R0981 Nasal congestion: Secondary | ICD-10-CM | POA: Diagnosis not present

## 2014-11-29 DIAGNOSIS — R05 Cough: Secondary | ICD-10-CM | POA: Diagnosis not present

## 2014-11-29 DIAGNOSIS — J449 Chronic obstructive pulmonary disease, unspecified: Secondary | ICD-10-CM | POA: Diagnosis not present

## 2015-01-09 ENCOUNTER — Encounter: Payer: Self-pay | Admitting: Licensed Clinical Social Worker

## 2015-01-09 ENCOUNTER — Other Ambulatory Visit: Payer: Self-pay | Admitting: Licensed Clinical Social Worker

## 2015-01-09 NOTE — Patient Outreach (Signed)
Assessment:  CSW completed chart review on client on 01/09/15.  Client had been mailed [facility] consent form several months ago but client never completed [facility] consent form and never mailed completed [facility] consent form to [facility] office in [place], [place].  [place] is thus discharging client on 01/09/15 since client has not completed [facility] consent form needed.  Plan: CSW is discharging George Andersen from [facility] CSW services on 01/09/15 since client has not completed [facility] consent form needed. CSW to inform George Andersen that George Andersen discharged client on 01/09/15 from [place] services. CSW to fax physician case closure letter to George Andersen informing George Andersen of client case closure by [facility] CSW services.  George Andersen.George Andersen MSW, LCSW Licensed Clinical Social Worker [facility] Care Management [phone]

## 2015-01-15 DIAGNOSIS — K5909 Other constipation: Secondary | ICD-10-CM | POA: Diagnosis present

## 2015-01-15 DIAGNOSIS — I10 Essential (primary) hypertension: Secondary | ICD-10-CM | POA: Diagnosis present

## 2015-01-15 DIAGNOSIS — Z8546 Personal history of malignant neoplasm of prostate: Secondary | ICD-10-CM | POA: Diagnosis not present

## 2015-01-15 DIAGNOSIS — Z8249 Family history of ischemic heart disease and other diseases of the circulatory system: Secondary | ICD-10-CM | POA: Diagnosis not present

## 2015-01-15 DIAGNOSIS — Z888 Allergy status to other drugs, medicaments and biological substances status: Secondary | ICD-10-CM | POA: Diagnosis not present

## 2015-01-15 DIAGNOSIS — Z7951 Long term (current) use of inhaled steroids: Secondary | ICD-10-CM | POA: Diagnosis not present

## 2015-01-15 DIAGNOSIS — Z79891 Long term (current) use of opiate analgesic: Secondary | ICD-10-CM | POA: Diagnosis not present

## 2015-01-15 DIAGNOSIS — Z800 Family history of malignant neoplasm of digestive organs: Secondary | ICD-10-CM | POA: Diagnosis not present

## 2015-01-15 DIAGNOSIS — G4700 Insomnia, unspecified: Secondary | ICD-10-CM | POA: Diagnosis present

## 2015-01-15 DIAGNOSIS — M1990 Unspecified osteoarthritis, unspecified site: Secondary | ICD-10-CM | POA: Diagnosis present

## 2015-01-15 DIAGNOSIS — E876 Hypokalemia: Secondary | ICD-10-CM | POA: Diagnosis not present

## 2015-01-15 DIAGNOSIS — Z7952 Long term (current) use of systemic steroids: Secondary | ICD-10-CM | POA: Diagnosis not present

## 2015-01-15 DIAGNOSIS — J189 Pneumonia, unspecified organism: Secondary | ICD-10-CM | POA: Diagnosis not present

## 2015-01-15 DIAGNOSIS — F419 Anxiety disorder, unspecified: Secondary | ICD-10-CM | POA: Diagnosis present

## 2015-01-15 DIAGNOSIS — R0602 Shortness of breath: Secondary | ICD-10-CM | POA: Diagnosis not present

## 2015-01-15 DIAGNOSIS — G629 Polyneuropathy, unspecified: Secondary | ICD-10-CM | POA: Diagnosis not present

## 2015-01-15 DIAGNOSIS — R197 Diarrhea, unspecified: Secondary | ICD-10-CM | POA: Diagnosis not present

## 2015-01-15 DIAGNOSIS — Z9981 Dependence on supplemental oxygen: Secondary | ICD-10-CM | POA: Diagnosis not present

## 2015-01-15 DIAGNOSIS — J440 Chronic obstructive pulmonary disease with acute lower respiratory infection: Secondary | ICD-10-CM | POA: Diagnosis not present

## 2015-01-15 DIAGNOSIS — Z87891 Personal history of nicotine dependence: Secondary | ICD-10-CM | POA: Diagnosis not present

## 2015-01-15 DIAGNOSIS — Z79899 Other long term (current) drug therapy: Secondary | ICD-10-CM | POA: Diagnosis not present

## 2015-01-15 DIAGNOSIS — J441 Chronic obstructive pulmonary disease with (acute) exacerbation: Secondary | ICD-10-CM | POA: Diagnosis not present

## 2015-01-15 DIAGNOSIS — M479 Spondylosis, unspecified: Secondary | ICD-10-CM | POA: Diagnosis present

## 2015-01-15 DIAGNOSIS — I739 Peripheral vascular disease, unspecified: Secondary | ICD-10-CM | POA: Diagnosis present

## 2015-01-15 DIAGNOSIS — Z7982 Long term (current) use of aspirin: Secondary | ICD-10-CM | POA: Diagnosis not present

## 2015-01-15 DIAGNOSIS — J9601 Acute respiratory failure with hypoxia: Secondary | ICD-10-CM | POA: Diagnosis not present

## 2015-01-28 DIAGNOSIS — R05 Cough: Secondary | ICD-10-CM | POA: Diagnosis not present

## 2015-02-20 DIAGNOSIS — R05 Cough: Secondary | ICD-10-CM | POA: Diagnosis not present

## 2015-02-20 DIAGNOSIS — J449 Chronic obstructive pulmonary disease, unspecified: Secondary | ICD-10-CM | POA: Diagnosis not present

## 2015-03-26 DIAGNOSIS — C61 Malignant neoplasm of prostate: Secondary | ICD-10-CM | POA: Diagnosis not present

## 2015-03-26 DIAGNOSIS — E784 Other hyperlipidemia: Secondary | ICD-10-CM | POA: Diagnosis not present

## 2015-03-26 DIAGNOSIS — E038 Other specified hypothyroidism: Secondary | ICD-10-CM | POA: Diagnosis not present

## 2015-03-26 DIAGNOSIS — I10 Essential (primary) hypertension: Secondary | ICD-10-CM | POA: Diagnosis not present

## 2015-03-26 DIAGNOSIS — E782 Mixed hyperlipidemia: Secondary | ICD-10-CM | POA: Diagnosis not present

## 2015-04-02 DIAGNOSIS — E782 Mixed hyperlipidemia: Secondary | ICD-10-CM | POA: Diagnosis not present

## 2015-04-02 DIAGNOSIS — J449 Chronic obstructive pulmonary disease, unspecified: Secondary | ICD-10-CM | POA: Diagnosis not present

## 2015-04-02 DIAGNOSIS — I739 Peripheral vascular disease, unspecified: Secondary | ICD-10-CM | POA: Diagnosis not present

## 2015-04-02 DIAGNOSIS — E038 Other specified hypothyroidism: Secondary | ICD-10-CM | POA: Diagnosis not present

## 2015-04-02 DIAGNOSIS — I10 Essential (primary) hypertension: Secondary | ICD-10-CM | POA: Diagnosis not present

## 2015-04-02 DIAGNOSIS — M545 Low back pain: Secondary | ICD-10-CM | POA: Diagnosis not present

## 2015-04-02 DIAGNOSIS — C61 Malignant neoplasm of prostate: Secondary | ICD-10-CM | POA: Diagnosis not present

## 2015-04-02 DIAGNOSIS — G6289 Other specified polyneuropathies: Secondary | ICD-10-CM | POA: Diagnosis not present

## 2015-04-02 DIAGNOSIS — J301 Allergic rhinitis due to pollen: Secondary | ICD-10-CM | POA: Diagnosis not present

## 2015-04-30 ENCOUNTER — Ambulatory Visit (INDEPENDENT_AMBULATORY_CARE_PROVIDER_SITE_OTHER): Payer: Medicare Other | Admitting: Neurology

## 2015-04-30 ENCOUNTER — Ambulatory Visit (INDEPENDENT_AMBULATORY_CARE_PROVIDER_SITE_OTHER): Payer: Self-pay | Admitting: Neurology

## 2015-04-30 DIAGNOSIS — Z0289 Encounter for other administrative examinations: Secondary | ICD-10-CM

## 2015-04-30 DIAGNOSIS — R202 Paresthesia of skin: Secondary | ICD-10-CM

## 2015-04-30 NOTE — Progress Notes (Signed)
See separate EMG nerve conduction study report under the procedure note dated April 30 2015
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
## 2015-05-06 DIAGNOSIS — J440 Chronic obstructive pulmonary disease with acute lower respiratory infection: Secondary | ICD-10-CM | POA: Diagnosis not present

## 2015-07-21 DIAGNOSIS — E038 Other specified hypothyroidism: Secondary | ICD-10-CM | POA: Diagnosis not present

## 2015-07-21 DIAGNOSIS — I10 Essential (primary) hypertension: Secondary | ICD-10-CM | POA: Diagnosis not present

## 2015-07-21 DIAGNOSIS — E782 Mixed hyperlipidemia: Secondary | ICD-10-CM | POA: Diagnosis not present

## 2015-07-23 DIAGNOSIS — I739 Peripheral vascular disease, unspecified: Secondary | ICD-10-CM | POA: Diagnosis not present

## 2015-07-23 DIAGNOSIS — E782 Mixed hyperlipidemia: Secondary | ICD-10-CM | POA: Diagnosis not present

## 2015-07-23 DIAGNOSIS — I10 Essential (primary) hypertension: Secondary | ICD-10-CM | POA: Diagnosis not present

## 2015-07-23 DIAGNOSIS — J301 Allergic rhinitis due to pollen: Secondary | ICD-10-CM | POA: Diagnosis not present

## 2015-07-23 DIAGNOSIS — J449 Chronic obstructive pulmonary disease, unspecified: Secondary | ICD-10-CM | POA: Diagnosis not present

## 2015-07-23 DIAGNOSIS — C61 Malignant neoplasm of prostate: Secondary | ICD-10-CM | POA: Diagnosis not present

## 2015-07-23 DIAGNOSIS — E038 Other specified hypothyroidism: Secondary | ICD-10-CM | POA: Diagnosis not present

## 2015-07-23 DIAGNOSIS — G6289 Other specified polyneuropathies: Secondary | ICD-10-CM | POA: Diagnosis not present

## 2015-09-24 DIAGNOSIS — Z6832 Body mass index (BMI) 32.0-32.9, adult: Secondary | ICD-10-CM | POA: Diagnosis not present

## 2015-09-24 DIAGNOSIS — J440 Chronic obstructive pulmonary disease with acute lower respiratory infection: Secondary | ICD-10-CM | POA: Diagnosis not present

## 2015-09-24 DIAGNOSIS — J9611 Chronic respiratory failure with hypoxia: Secondary | ICD-10-CM | POA: Diagnosis not present

## 2015-09-29 DIAGNOSIS — J440 Chronic obstructive pulmonary disease with acute lower respiratory infection: Secondary | ICD-10-CM | POA: Diagnosis not present

## 2015-09-29 DIAGNOSIS — K219 Gastro-esophageal reflux disease without esophagitis: Secondary | ICD-10-CM | POA: Diagnosis not present

## 2015-09-29 DIAGNOSIS — Z6831 Body mass index (BMI) 31.0-31.9, adult: Secondary | ICD-10-CM | POA: Diagnosis not present

## 2015-10-14 DIAGNOSIS — Z23 Encounter for immunization: Secondary | ICD-10-CM | POA: Diagnosis not present

## 2015-10-14 DIAGNOSIS — J440 Chronic obstructive pulmonary disease with acute lower respiratory infection: Secondary | ICD-10-CM | POA: Diagnosis not present

## 2015-10-14 DIAGNOSIS — Z6832 Body mass index (BMI) 32.0-32.9, adult: Secondary | ICD-10-CM | POA: Diagnosis not present

## 2015-10-27 DIAGNOSIS — J301 Allergic rhinitis due to pollen: Secondary | ICD-10-CM | POA: Diagnosis not present

## 2015-10-27 DIAGNOSIS — J440 Chronic obstructive pulmonary disease with acute lower respiratory infection: Secondary | ICD-10-CM | POA: Diagnosis not present

## 2015-10-27 DIAGNOSIS — Z6832 Body mass index (BMI) 32.0-32.9, adult: Secondary | ICD-10-CM | POA: Diagnosis not present

## 2015-10-30 DIAGNOSIS — Z8249 Family history of ischemic heart disease and other diseases of the circulatory system: Secondary | ICD-10-CM | POA: Diagnosis not present

## 2015-10-30 DIAGNOSIS — J9621 Acute and chronic respiratory failure with hypoxia: Secondary | ICD-10-CM | POA: Diagnosis present

## 2015-10-30 DIAGNOSIS — G5793 Unspecified mononeuropathy of bilateral lower limbs: Secondary | ICD-10-CM | POA: Diagnosis present

## 2015-10-30 DIAGNOSIS — Z9981 Dependence on supplemental oxygen: Secondary | ICD-10-CM | POA: Diagnosis not present

## 2015-10-30 DIAGNOSIS — Z888 Allergy status to other drugs, medicaments and biological substances status: Secondary | ICD-10-CM | POA: Diagnosis not present

## 2015-10-30 DIAGNOSIS — F329 Major depressive disorder, single episode, unspecified: Secondary | ICD-10-CM | POA: Diagnosis present

## 2015-10-30 DIAGNOSIS — Z7951 Long term (current) use of inhaled steroids: Secondary | ICD-10-CM | POA: Diagnosis not present

## 2015-10-30 DIAGNOSIS — R0602 Shortness of breath: Secondary | ICD-10-CM | POA: Diagnosis not present

## 2015-10-30 DIAGNOSIS — Z825 Family history of asthma and other chronic lower respiratory diseases: Secondary | ICD-10-CM | POA: Diagnosis not present

## 2015-10-30 DIAGNOSIS — F410 Panic disorder [episodic paroxysmal anxiety] without agoraphobia: Secondary | ICD-10-CM | POA: Diagnosis present

## 2015-10-30 DIAGNOSIS — E785 Hyperlipidemia, unspecified: Secondary | ICD-10-CM | POA: Diagnosis present

## 2015-10-30 DIAGNOSIS — Z7982 Long term (current) use of aspirin: Secondary | ICD-10-CM | POA: Diagnosis not present

## 2015-10-30 DIAGNOSIS — Z79899 Other long term (current) drug therapy: Secondary | ICD-10-CM | POA: Diagnosis not present

## 2015-10-30 DIAGNOSIS — M5136 Other intervertebral disc degeneration, lumbar region: Secondary | ICD-10-CM | POA: Diagnosis present

## 2015-10-30 DIAGNOSIS — E876 Hypokalemia: Secondary | ICD-10-CM | POA: Diagnosis present

## 2015-10-30 DIAGNOSIS — Z87891 Personal history of nicotine dependence: Secondary | ICD-10-CM | POA: Diagnosis not present

## 2015-10-30 DIAGNOSIS — J441 Chronic obstructive pulmonary disease with (acute) exacerbation: Secondary | ICD-10-CM | POA: Diagnosis present

## 2015-10-30 DIAGNOSIS — J9601 Acute respiratory failure with hypoxia: Secondary | ICD-10-CM | POA: Diagnosis not present

## 2015-10-30 DIAGNOSIS — K219 Gastro-esophageal reflux disease without esophagitis: Secondary | ICD-10-CM | POA: Diagnosis present

## 2015-10-30 DIAGNOSIS — I739 Peripheral vascular disease, unspecified: Secondary | ICD-10-CM | POA: Diagnosis present

## 2015-10-30 DIAGNOSIS — Z79891 Long term (current) use of opiate analgesic: Secondary | ICD-10-CM | POA: Diagnosis not present

## 2015-10-30 DIAGNOSIS — I10 Essential (primary) hypertension: Secondary | ICD-10-CM | POA: Diagnosis present

## 2015-10-30 DIAGNOSIS — J309 Allergic rhinitis, unspecified: Secondary | ICD-10-CM | POA: Diagnosis present

## 2015-10-30 DIAGNOSIS — Z800 Family history of malignant neoplasm of digestive organs: Secondary | ICD-10-CM | POA: Diagnosis not present

## 2015-10-30 DIAGNOSIS — Z886 Allergy status to analgesic agent status: Secondary | ICD-10-CM | POA: Diagnosis not present

## 2015-10-30 DIAGNOSIS — J440 Chronic obstructive pulmonary disease with acute lower respiratory infection: Secondary | ICD-10-CM | POA: Diagnosis not present

## 2015-12-02 DIAGNOSIS — J9611 Chronic respiratory failure with hypoxia: Secondary | ICD-10-CM | POA: Diagnosis not present

## 2015-12-02 DIAGNOSIS — Z6832 Body mass index (BMI) 32.0-32.9, adult: Secondary | ICD-10-CM | POA: Diagnosis not present

## 2015-12-02 DIAGNOSIS — J440 Chronic obstructive pulmonary disease with acute lower respiratory infection: Secondary | ICD-10-CM | POA: Diagnosis not present

## 2015-12-04 DIAGNOSIS — Z6832 Body mass index (BMI) 32.0-32.9, adult: Secondary | ICD-10-CM | POA: Diagnosis not present

## 2015-12-04 DIAGNOSIS — J9611 Chronic respiratory failure with hypoxia: Secondary | ICD-10-CM | POA: Diagnosis not present

## 2015-12-04 DIAGNOSIS — J440 Chronic obstructive pulmonary disease with acute lower respiratory infection: Secondary | ICD-10-CM | POA: Diagnosis not present

## 2016-02-05 DIAGNOSIS — J449 Chronic obstructive pulmonary disease, unspecified: Secondary | ICD-10-CM | POA: Diagnosis not present

## 2016-02-05 DIAGNOSIS — I10 Essential (primary) hypertension: Secondary | ICD-10-CM | POA: Diagnosis not present

## 2016-02-05 DIAGNOSIS — E782 Mixed hyperlipidemia: Secondary | ICD-10-CM | POA: Diagnosis not present

## 2016-02-05 DIAGNOSIS — E784 Other hyperlipidemia: Secondary | ICD-10-CM | POA: Diagnosis not present

## 2016-02-05 DIAGNOSIS — K219 Gastro-esophageal reflux disease without esophagitis: Secondary | ICD-10-CM | POA: Diagnosis not present

## 2016-02-05 DIAGNOSIS — E038 Other specified hypothyroidism: Secondary | ICD-10-CM | POA: Diagnosis not present

## 2016-02-09 DIAGNOSIS — J449 Chronic obstructive pulmonary disease, unspecified: Secondary | ICD-10-CM | POA: Diagnosis not present

## 2016-02-09 DIAGNOSIS — I739 Peripheral vascular disease, unspecified: Secondary | ICD-10-CM | POA: Diagnosis not present

## 2016-02-09 DIAGNOSIS — J9611 Chronic respiratory failure with hypoxia: Secondary | ICD-10-CM | POA: Diagnosis not present

## 2016-02-09 DIAGNOSIS — C61 Malignant neoplasm of prostate: Secondary | ICD-10-CM | POA: Diagnosis not present

## 2016-02-09 DIAGNOSIS — Z1212 Encounter for screening for malignant neoplasm of rectum: Secondary | ICD-10-CM | POA: Diagnosis not present

## 2016-02-09 DIAGNOSIS — M545 Low back pain: Secondary | ICD-10-CM | POA: Diagnosis not present

## 2016-02-09 DIAGNOSIS — E782 Mixed hyperlipidemia: Secondary | ICD-10-CM | POA: Diagnosis not present

## 2016-02-09 DIAGNOSIS — Z6833 Body mass index (BMI) 33.0-33.9, adult: Secondary | ICD-10-CM | POA: Diagnosis not present

## 2016-02-10 DIAGNOSIS — Z79891 Long term (current) use of opiate analgesic: Secondary | ICD-10-CM | POA: Diagnosis not present

## 2016-02-26 DIAGNOSIS — J440 Chronic obstructive pulmonary disease with acute lower respiratory infection: Secondary | ICD-10-CM | POA: Diagnosis not present

## 2016-02-26 DIAGNOSIS — J9611 Chronic respiratory failure with hypoxia: Secondary | ICD-10-CM | POA: Diagnosis not present

## 2016-02-26 DIAGNOSIS — Z6833 Body mass index (BMI) 33.0-33.9, adult: Secondary | ICD-10-CM | POA: Diagnosis not present

## 2016-03-01 DIAGNOSIS — M5136 Other intervertebral disc degeneration, lumbar region: Secondary | ICD-10-CM | POA: Diagnosis present

## 2016-03-01 DIAGNOSIS — G8929 Other chronic pain: Secondary | ICD-10-CM | POA: Diagnosis present

## 2016-03-01 DIAGNOSIS — F339 Major depressive disorder, recurrent, unspecified: Secondary | ICD-10-CM | POA: Diagnosis not present

## 2016-03-01 DIAGNOSIS — B370 Candidal stomatitis: Secondary | ICD-10-CM | POA: Diagnosis not present

## 2016-03-01 DIAGNOSIS — Z9981 Dependence on supplemental oxygen: Secondary | ICD-10-CM | POA: Diagnosis not present

## 2016-03-01 DIAGNOSIS — I739 Peripheral vascular disease, unspecified: Secondary | ICD-10-CM | POA: Diagnosis present

## 2016-03-01 DIAGNOSIS — Z79891 Long term (current) use of opiate analgesic: Secondary | ICD-10-CM | POA: Diagnosis not present

## 2016-03-01 DIAGNOSIS — Z888 Allergy status to other drugs, medicaments and biological substances status: Secondary | ICD-10-CM | POA: Diagnosis not present

## 2016-03-01 DIAGNOSIS — R05 Cough: Secondary | ICD-10-CM | POA: Diagnosis not present

## 2016-03-01 DIAGNOSIS — E785 Hyperlipidemia, unspecified: Secondary | ICD-10-CM | POA: Diagnosis present

## 2016-03-01 DIAGNOSIS — M545 Low back pain: Secondary | ICD-10-CM | POA: Diagnosis present

## 2016-03-01 DIAGNOSIS — Z79899 Other long term (current) drug therapy: Secondary | ICD-10-CM | POA: Diagnosis not present

## 2016-03-01 DIAGNOSIS — Z800 Family history of malignant neoplasm of digestive organs: Secondary | ICD-10-CM | POA: Diagnosis not present

## 2016-03-01 DIAGNOSIS — Z825 Family history of asthma and other chronic lower respiratory diseases: Secondary | ICD-10-CM | POA: Diagnosis not present

## 2016-03-01 DIAGNOSIS — J9612 Chronic respiratory failure with hypercapnia: Secondary | ICD-10-CM | POA: Diagnosis not present

## 2016-03-01 DIAGNOSIS — J440 Chronic obstructive pulmonary disease with acute lower respiratory infection: Secondary | ICD-10-CM | POA: Diagnosis present

## 2016-03-01 DIAGNOSIS — J209 Acute bronchitis, unspecified: Secondary | ICD-10-CM | POA: Diagnosis not present

## 2016-03-01 DIAGNOSIS — J309 Allergic rhinitis, unspecified: Secondary | ICD-10-CM | POA: Diagnosis present

## 2016-03-01 DIAGNOSIS — R0902 Hypoxemia: Secondary | ICD-10-CM | POA: Diagnosis not present

## 2016-03-01 DIAGNOSIS — Z87891 Personal history of nicotine dependence: Secondary | ICD-10-CM | POA: Diagnosis not present

## 2016-03-01 DIAGNOSIS — K219 Gastro-esophageal reflux disease without esophagitis: Secondary | ICD-10-CM | POA: Diagnosis present

## 2016-03-01 DIAGNOSIS — I10 Essential (primary) hypertension: Secondary | ICD-10-CM | POA: Diagnosis present

## 2016-03-01 DIAGNOSIS — R0602 Shortness of breath: Secondary | ICD-10-CM | POA: Diagnosis not present

## 2016-03-01 DIAGNOSIS — Z8546 Personal history of malignant neoplasm of prostate: Secondary | ICD-10-CM | POA: Diagnosis not present

## 2016-03-01 DIAGNOSIS — J441 Chronic obstructive pulmonary disease with (acute) exacerbation: Secondary | ICD-10-CM | POA: Diagnosis not present

## 2016-03-01 DIAGNOSIS — J9601 Acute respiratory failure with hypoxia: Secondary | ICD-10-CM | POA: Diagnosis not present

## 2016-03-01 DIAGNOSIS — F410 Panic disorder [episodic paroxysmal anxiety] without agoraphobia: Secondary | ICD-10-CM | POA: Diagnosis present

## 2016-03-01 DIAGNOSIS — Z8249 Family history of ischemic heart disease and other diseases of the circulatory system: Secondary | ICD-10-CM | POA: Diagnosis not present

## 2016-03-01 DIAGNOSIS — Z7982 Long term (current) use of aspirin: Secondary | ICD-10-CM | POA: Diagnosis not present

## 2016-03-11 ENCOUNTER — Other Ambulatory Visit: Payer: Medicare Other

## 2016-03-11 ENCOUNTER — Ambulatory Visit (INDEPENDENT_AMBULATORY_CARE_PROVIDER_SITE_OTHER): Payer: Medicare Other | Admitting: Pulmonary Disease

## 2016-03-11 ENCOUNTER — Encounter: Payer: Self-pay | Admitting: Pulmonary Disease

## 2016-03-11 VITALS — BP 128/70 | HR 128 | Ht 71.0 in | Wt 242.6 lb

## 2016-03-11 DIAGNOSIS — J449 Chronic obstructive pulmonary disease, unspecified: Secondary | ICD-10-CM | POA: Diagnosis not present

## 2016-03-11 MED ORDER — GLYCOPYRROLATE-FORMOTEROL 9-4.8 MCG/ACT IN AERO: 2.0000 | INHALATION_SPRAY | Freq: Two times a day (BID) | RESPIRATORY_TRACT | 5 refills | Status: DC

## 2016-03-11 MED ORDER — GLYCOPYRROLATE-FORMOTEROL 9-4.8 MCG/ACT IN AERO: 2.0000 | INHALATION_SPRAY | Freq: Two times a day (BID) | RESPIRATORY_TRACT | 0 refills | Status: AC

## 2016-03-11 NOTE — Patient Instructions (Signed)
We will check alpha-1 antitrypsin levels and phenotype Continue using the Pulmicort nebulizer Continue using the albuterol inhaler and nebs We will start you on bevespi inhaler  Return in 1-2 months

## 2016-03-11 NOTE — Addendum Note (Signed)
Addended by: Maryanna Shape A on: 03/11/2016 03:47 PM   Modules accepted: Orders

## 2016-03-11 NOTE — Progress Notes (Signed)
George Andersen    [id number]    [birth year]  Primary Care Physician:TERRY DANIEL, MD  Referring Physician: Caryl Bis, MD [place], [place] [address]  Chief complaint: Consult for management of COPD  HPI: George Andersen is 71 year old with past medical history of COPD (CAT score 26), peripheral artery disease, depression, panic attack, hyperlipidemia, hypertension, GERD. He was admitted to [facility] in [place] from 1/29 to 03/06/16 with acute hypercapnic and hypoxic respiratory failure, bronchitis, COPD exacerbation. He was treated with antibiotics [Doxy plus Levaquin], IV steroids, nebulizers. . Chest x-ray during this admission which did not show any infiltrates but hyperinflation. He also had a spirometry which showed FEV1 of 16% of predicted. ABG was 7.44/49/83.  He is on albuterol nebs, pro-air and Pulmicort nebulizers. He was also on chronic prednisone at 10 mg. This dose of prednisone was increased at this admission and he appears to be currently on 20 mg but is not sure of the exact dose. He had tried Symbicort in the past but felt it did not help her symptoms. He has chronic dyspnea on exertion, cough with mucus production, wheezing. He denies any fevers, chills. He has about 20-pack-year smoking history. He quit in 1990. There is no family history of lung problems.  Outpatient Encounter Prescriptions as of 03/11/2016  Medication Sig  . ALPRAZolam (XANAX) 0.5 MG tablet Take 0.5 mg by mouth at bedtime as needed for anxiety.  [name] amLODipine (NORVASC) 10 MG tablet TK 1 T PO QD  . doxycycline (DORYX) 100 MG EC tablet Take 100 mg by mouth 2 (two) times daily.  . DULoxetine (CYMBALTA) 20 MG capsule Take 20 mg by mouth daily.  . fluconazole (DIFLUCAN) 100 MG tablet   . hydrocortisone (CORTEF) 20 MG tablet   . ipratropium-albuterol (DUONEB) 0.5-2.5 (3) MG/3ML SOLN INHALE THE CONTENTS OF 1 VIAL VIA NEBULIZER FOUR TIMES A DAY  . montelukast (SINGULAIR) 10 MG  tablet TK 1 T PO QD  . Oxycodone HCl 20 MG TABS   . predniSONE (DELTASONE) 20 MG tablet Take 20 mg by mouth daily with breakfast.  . simvastatin (ZOCOR) 40 MG tablet TK 1 T PO  QD FOR HIGH CHOLESTEROL  . VENTOLIN HFA 108 (90 Base) MCG/ACT inhaler   . [DISCONTINUED] predniSONE (DELTASONE) 50 MG tablet    No facility-administered encounter medications on file as of 03/11/2016.     Allergies as of 03/11/2016  . (Not on File)    Past Medical History:  Diagnosis Date  . COPD (chronic obstructive pulmonary disease) ([place])   . Emphysema lung ([place])     Past Surgical History:  Procedure Laterality Date  . SHOULDER SURGERY      Family History  Problem Relation Age of Onset  . Heart attack Mother   . Throat cancer Father     Social History   Social History  . Marital status: Married    Spouse name: N/A  . Number of children: N/A  . Years of education: N/A   Occupational History  . Not on file.   Social History Main Topics  . Smoking status: Former Smoker    Packs/day: 1.00    Years: 20.00    Types: Cigarettes  . Smokeless tobacco: Never Used     Comment: quit smoking in 1990  . Alcohol use Yes     Comment: occ  . Drug use: Yes    Types: Hashish  . Sexual activity: Not  on file   Other Topics Concern  . Not on file   Social History Narrative  . No narrative on file    Review of systems: Review of Systems  Constitutional: Negative for fever and chills.  HENT: Negative.   Eyes: Negative for blurred vision.  Respiratory: as per HPI  Cardiovascular: Negative for chest pain and palpitations.  Gastrointestinal: Negative for vomiting, diarrhea, blood per rectum. Genitourinary: Negative for dysuria, urgency, frequency and hematuria.  Musculoskeletal: Negative for myalgias, back pain and joint pain.  Skin: Negative for itching and rash.  Neurological: Negative for dizziness, tremors, focal weakness, seizures and loss of consciousness.  Endo/Heme/Allergies: Negative  for environmental allergies.  Psychiatric/Behavioral: Negative for depression, suicidal ideas and hallucinations.  All other systems reviewed and are negative.  Physical Exam: Blood pressure 128/70, pulse (!) 128, height 5\' 11"  (1.803 m), weight 110 kg (242 lb 9.6 oz), SpO2 92 %. Gen:      No acute distress HEENT:  EOMI, sclera anicteric Neck:     No masses; no thyromegaly Lungs:    B/L scattered wheeze; normal respiratory effort CV:         Regular rate and rhythm; no murmurs Abd:      + bowel sounds; soft, non-tender; no palpable masses, no distension Ext:    No edema; adequate peripheral perfusion Skin:      Warm and dry; no rash Neuro: alert and oriented x 3 Psych: normal mood and affect  Data Reviewed: CXR 12/22/07- no active disease  Assessment:  Evaluation of COPD George Andersen likely has severe COPD based on his presentation. FEV1 was noted to be 16% during his last hospitalization but this was done in the middle of an exacerbation. We need to repeat a more formal set of pulmonary function test after he's recovered from this acute episode.  He still wheezing in today and will benefit from up titration of his inhalers. He'll continue on the Pulmicort nebulizer. I'll add an inhaled LABA/LAMA therapy with bevespi. We will check A1AT levels and phenotype for evaluation of A1 deficiency.  End-of-life discussion was initiated by his primary physician Dr. Quillian Quince at last visit. He still undecided about this in the office today.  Plan/Recommendations: - Continue pulmicort, albuterol, rescue inhaler and nebs - Continue prednisone and Home O2 - Add Bevespi - Check A1AT levels and phenotype,  Marshell Garfinkel MD [place] [facility] Pager [phone] 03/11/2016, 3:04 PM  CC: Caryl Bis, MD

## 2016-03-16 LAB — ALPHA-1 ANTITRYPSIN PHENOTYPE: A-1 Antitrypsin: 130 mg/dL (ref 83–199)

## 2016-04-05 DIAGNOSIS — Z6833 Body mass index (BMI) 33.0-33.9, adult: Secondary | ICD-10-CM | POA: Diagnosis not present

## 2016-04-05 DIAGNOSIS — J440 Chronic obstructive pulmonary disease with acute lower respiratory infection: Secondary | ICD-10-CM | POA: Diagnosis not present

## 2016-04-13 ENCOUNTER — Encounter: Payer: Self-pay | Admitting: Pulmonary Disease

## 2016-04-13 ENCOUNTER — Ambulatory Visit (INDEPENDENT_AMBULATORY_CARE_PROVIDER_SITE_OTHER): Payer: Medicare Other | Admitting: Pulmonary Disease

## 2016-04-13 VITALS — BP 136/62 | HR 128 | Ht 71.0 in | Wt 239.2 lb

## 2016-04-13 DIAGNOSIS — J449 Chronic obstructive pulmonary disease, unspecified: Secondary | ICD-10-CM | POA: Diagnosis not present

## 2016-04-13 DIAGNOSIS — R05 Cough: Secondary | ICD-10-CM | POA: Diagnosis not present

## 2016-04-13 DIAGNOSIS — R059 Cough, unspecified: Secondary | ICD-10-CM

## 2016-04-13 LAB — POCT INFLUENZA A/B
INFLUENZA B, POC: NEGATIVE
Influenza A, POC: NEGATIVE

## 2016-04-13 MED ORDER — UMECLIDINIUM-VILANTEROL 62.5-25 MCG/INH IN AEPB: 1.0000 | INHALATION_SPRAY | Freq: Every day | RESPIRATORY_TRACT | 0 refills | Status: AC

## 2016-04-13 MED ORDER — UMECLIDINIUM-VILANTEROL 62.5-25 MCG/INH IN AEPB
1.0000 | INHALATION_SPRAY | Freq: Every day | RESPIRATORY_TRACT | 5 refills | Status: DC
Start: 2016-04-13 — End: 2016-07-29

## 2016-04-13 NOTE — Progress Notes (Addendum)
George Andersen    [id number]    1945-02-03  Primary Care Physician:TERRY DANIEL, MD  Referring Physician: Caryl Bis, MD [place], [place] [address]  Chief complaint:  Follow up for COPD  HPI: [name] is 71 year old with past medical history of COPD (CAT score 26), peripheral artery disease, depression, panic attack, hyperlipidemia, hypertension, GERD. He was admitted to [facility] in [place] from 1/29 to 03/06/16 with acute hypercapnic and hypoxic respiratory failure, bronchitis, COPD exacerbation. He was treated with antibiotics [Doxy plus Levaquin], IV steroids, nebulizers. . Chest x-ray during this admission which did not show any infiltrates but hyperinflation. He also had a spirometry which showed FEV1 of 16% of predicted. ABG was 7.44/49/83.  He is on albuterol nebs, pro-air and Pulmicort nebulizers. He was also on chronic prednisone at 10 mg. This dose of prednisone was increased at this admission and he appears to be currently on 20 mg but is not sure of the exact dose. He had tried Symbicort in the past but felt it did not help her symptoms. He has chronic dyspnea on exertion, cough with mucus production, wheezing. He denies any fevers, chills. He has about 20-pack-year smoking history. He quit in 1990. There is no family history of lung problems.  Interim History: At last visit he was started on bevespi in addition to Pulmicort nebs. He states that the copay for bevespi is over 200 dollars and he cannot afford it. But he has managed to get one months supply. Breathing is better compared to last visit. He still has occasional dyspnea on exertion, wheezing, cough, mucus.   Outpatient Encounter Prescriptions as of 04/13/2016  Medication Sig  . ALPRAZolam (XANAX) 0.5 MG tablet Take 0.5 mg by mouth at bedtime as needed for anxiety.  [name] amLODipine (NORVASC) 10 MG tablet TK 1 T PO QD  . doxycycline (DORYX) 100 MG EC tablet Take 100 mg by mouth 2 (two)  times daily.  . DULoxetine (CYMBALTA) 20 MG capsule Take 20 mg by mouth daily.  . Glycopyrrolate-Formoterol (BEVESPI AEROSPHERE) 9-4.8 MCG/ACT AERO Inhale 2 puffs into the lungs 2 (two) times daily.  . hydrocortisone (CORTEF) 20 MG tablet   . ipratropium-albuterol (DUONEB) 0.5-2.5 (3) MG/3ML SOLN INHALE THE CONTENTS OF 1 VIAL VIA NEBULIZER FOUR TIMES A DAY  . losartan-hydrochlorothiazide (HYZAAR) 100-12.5 MG tablet Take 1 tablet by mouth daily.  . montelukast (SINGULAIR) 10 MG tablet TK 1 T PO QD  . Oxycodone HCl 20 MG TABS   . predniSONE (DELTASONE) 20 MG tablet Take 20 mg by mouth daily with breakfast.  . simvastatin (ZOCOR) 40 MG tablet TK 1 T PO  QD FOR HIGH CHOLESTEROL  . VENTOLIN HFA 108 (90 Base) MCG/ACT inhaler   . [DISCONTINUED] fluconazole (DIFLUCAN) 100 MG tablet    No facility-administered encounter medications on file as of 04/13/2016.     Allergies as of 04/13/2016  . (Not on File)    Past Medical History:  Diagnosis Date  . COPD (chronic obstructive pulmonary disease) ([place])   . Emphysema lung ([place])     Past Surgical History:  Procedure Laterality Date  . SHOULDER SURGERY      Family History  Problem Relation Age of Onset  . Heart attack Mother   . Throat cancer Father     Social History   Social History  . Marital status: Married    Spouse name: N/A  . Number of children: N/A  .  Years of education: N/A   Occupational History  . Not on file.   Social History Main Topics  . Smoking status: Former Smoker    Packs/day: 1.00    Years: 20.00    Types: Cigarettes  . Smokeless tobacco: Never Used     Comment: quit smoking in 1990  . Alcohol use Yes     Comment: occ  . Drug use: Yes    Types: Hashish  . Sexual activity: Not on file   Other Topics Concern  . Not on file   Social History Narrative  . No narrative on file    Review of systems: Review of Systems  Constitutional: Negative for fever and chills.  HENT: Negative.   Eyes: Negative  for blurred vision.  Respiratory: as per HPI  Cardiovascular: Negative for chest pain and palpitations.  Gastrointestinal: Negative for vomiting, diarrhea, blood per rectum. Genitourinary: Negative for dysuria, urgency, frequency and hematuria.  Musculoskeletal: Negative for myalgias, back pain and joint pain.  Skin: Negative for itching and rash.  Neurological: Negative for dizziness, tremors, focal weakness, seizures and loss of consciousness.  Endo/Heme/Allergies: Negative for environmental allergies.  Psychiatric/Behavioral: Negative for depression, suicidal ideas and hallucinations.  All other systems reviewed and are negative.  Physical Exam: Blood pressure 136/62, pulse (!) 128, height 5\' 11"  (1.803 m), weight 239 lb 3.2 oz (108.5 kg), SpO2 96 %. Gen:      No acute distress HEENT:  EOMI, sclera anicteric Neck:     No masses; no thyromegaly Lungs:    Clear to auscultation bilaterally; normal respiratory effort CV:         Regular rate and rhythm; no murmurs Abd:      + bowel sounds; soft, non-tender; no palpable masses, no distension Ext:    No edema; adequate peripheral perfusion Skin:      Warm and dry; no rash Neuro: alert and oriented x 3 Psych: normal mood and affect Data Reviewed: CXR 12/22/07- no active disease  A1AT 03/11/16- 130, PIMM phenotype.  Assessment:  Evaluation of COPD Mr. Litt likely has severe COPD based on his presentation. FEV1 was noted to be 16% during his last hospitalization but this was done in the middle of an exacerbation. We need to repeat a more formal set of pulmonary function test after he's recovered from the acute episode.  He was started on  inhaled LABA/LAMA therapy with bevespi but cannot afford the copay. I will switch to anoro elipta. He will continue the budesonide nebs and prednisone at 10 mg daily. He was walked clinic today and had desats on ambulation. He will benefit from pulmonary rehab but is concerned about the cost. We will  readdress at next visit.  End-of-life discussion was initiated by his primary physician Dr. Quillian Quince but he is still undecided about this in the office today and states that he wants to live longer to see his grandchildren grow up.   Plan/Recommendations: - Continue pulmicort, albuterol, rescue inhaler and nebs - Continue prednisone 10 mg - Continue Home O2 with ambulation and at night - Change bevspi to anoro. - PFTs before next visit  Marshell Garfinkel MD [place] [facility] Pager [phone] 04/13/2016, 2:06 PM  CC: Caryl Bis, MD

## 2016-04-13 NOTE — Addendum Note (Signed)
Addended by: Maryanna Shape A on: 04/13/2016 05:29 PM   Modules accepted: Orders

## 2016-04-13 NOTE — Addendum Note (Signed)
Addended by: Maryanna Shape A on: 04/13/2016 03:10 PM   Modules accepted: Orders

## 2016-04-13 NOTE — Patient Instructions (Addendum)
Continue using the nebulizers as prescribed Finished the bevespi inhaler that you already have. At the end of the month we will change you to anoro elipta. Please call us for samples if the co pay is very high  Continue using the O2 with exertion and at night PFTs before next visit.  Return in 3 months

## 2016-04-23 DIAGNOSIS — Z6833 Body mass index (BMI) 33.0-33.9, adult: Secondary | ICD-10-CM | POA: Diagnosis not present

## 2016-04-23 DIAGNOSIS — R5383 Other fatigue: Secondary | ICD-10-CM | POA: Diagnosis not present

## 2016-04-23 DIAGNOSIS — R531 Weakness: Secondary | ICD-10-CM | POA: Diagnosis not present

## 2016-04-23 DIAGNOSIS — I951 Orthostatic hypotension: Secondary | ICD-10-CM | POA: Diagnosis not present

## 2016-04-23 DIAGNOSIS — M545 Low back pain: Secondary | ICD-10-CM | POA: Diagnosis not present

## 2016-04-23 DIAGNOSIS — J9611 Chronic respiratory failure with hypoxia: Secondary | ICD-10-CM | POA: Diagnosis not present

## 2016-04-23 DIAGNOSIS — J440 Chronic obstructive pulmonary disease with acute lower respiratory infection: Secondary | ICD-10-CM | POA: Diagnosis not present

## 2016-05-03 DIAGNOSIS — J449 Chronic obstructive pulmonary disease, unspecified: Secondary | ICD-10-CM | POA: Diagnosis not present

## 2016-05-03 DIAGNOSIS — M545 Low back pain: Secondary | ICD-10-CM | POA: Diagnosis not present

## 2016-05-03 DIAGNOSIS — J9611 Chronic respiratory failure with hypoxia: Secondary | ICD-10-CM | POA: Diagnosis not present

## 2016-05-03 DIAGNOSIS — J301 Allergic rhinitis due to pollen: Secondary | ICD-10-CM | POA: Diagnosis not present

## 2016-05-03 DIAGNOSIS — C61 Malignant neoplasm of prostate: Secondary | ICD-10-CM | POA: Diagnosis not present

## 2016-05-03 DIAGNOSIS — Z1212 Encounter for screening for malignant neoplasm of rectum: Secondary | ICD-10-CM | POA: Diagnosis not present

## 2016-05-03 DIAGNOSIS — E038 Other specified hypothyroidism: Secondary | ICD-10-CM | POA: Diagnosis not present

## 2016-05-03 DIAGNOSIS — I10 Essential (primary) hypertension: Secondary | ICD-10-CM | POA: Diagnosis not present

## 2016-05-03 DIAGNOSIS — G6289 Other specified polyneuropathies: Secondary | ICD-10-CM | POA: Diagnosis not present

## 2016-05-03 DIAGNOSIS — I739 Peripheral vascular disease, unspecified: Secondary | ICD-10-CM | POA: Diagnosis not present

## 2016-05-03 DIAGNOSIS — E782 Mixed hyperlipidemia: Secondary | ICD-10-CM | POA: Diagnosis not present

## 2016-05-03 DIAGNOSIS — Z6834 Body mass index (BMI) 34.0-34.9, adult: Secondary | ICD-10-CM | POA: Diagnosis not present

## 2016-06-03 DIAGNOSIS — B370 Candidal stomatitis: Secondary | ICD-10-CM | POA: Diagnosis not present

## 2016-06-03 DIAGNOSIS — Z6832 Body mass index (BMI) 32.0-32.9, adult: Secondary | ICD-10-CM | POA: Diagnosis not present

## 2016-06-03 DIAGNOSIS — J449 Chronic obstructive pulmonary disease, unspecified: Secondary | ICD-10-CM | POA: Diagnosis not present

## 2016-06-03 DIAGNOSIS — B3781 Candidal esophagitis: Secondary | ICD-10-CM | POA: Diagnosis not present

## 2016-07-09 ENCOUNTER — Ambulatory Visit (INDEPENDENT_AMBULATORY_CARE_PROVIDER_SITE_OTHER): Payer: Medicare Other | Admitting: Pulmonary Disease

## 2016-07-09 ENCOUNTER — Encounter: Payer: Self-pay | Admitting: Pulmonary Disease

## 2016-07-09 VITALS — BP 160/92 | HR 102 | Ht 71.0 in | Wt 236.0 lb

## 2016-07-09 DIAGNOSIS — J449 Chronic obstructive pulmonary disease, unspecified: Secondary | ICD-10-CM

## 2016-07-09 LAB — PULMONARY FUNCTION TEST
DL/VA % PRED: 46 %
DL/VA: 2.14 ml/min/mmHg/L
DLCO cor % pred: 31 %
DLCO cor: 10.5 ml/min/mmHg
DLCO unc % pred: 33 %
DLCO unc: 10.92 ml/min/mmHg
FEF 25-75 Post: 0.59 L/sec
FEF 25-75 Pre: 0.53 L/sec
FEF2575-%CHANGE-POST: 10 %
FEF2575-%Pred-Post: 23 %
FEF2575-%Pred-Pre: 20 %
FEV1-%Change-Post: 5 %
FEV1-%PRED-PRE: 44 %
FEV1-%Pred-Post: 46 %
FEV1-PRE: 1.32 L
FEV1-Post: 1.39 L
FEV1FVC-%CHANGE-POST: 1 %
FEV1FVC-%Pred-Pre: 67 %
FEV6-%Change-Post: 2 %
FEV6-%Pred-Post: 67 %
FEV6-%Pred-Pre: 65 %
FEV6-POST: 2.53 L
FEV6-PRE: 2.46 L
FEV6FVC-%Change-Post: 0 %
FEV6FVC-%PRED-POST: 99 %
FEV6FVC-%PRED-PRE: 100 %
FVC-%CHANGE-POST: 3 %
FVC-%PRED-POST: 67 %
FVC-%PRED-PRE: 65 %
FVC-POST: 2.64 L
FVC-PRE: 2.56 L
POST FEV6/FVC RATIO: 96 %
Post FEV1/FVC ratio: 53 %
Pre FEV1/FVC ratio: 52 %
Pre FEV6/FVC Ratio: 96 %
RV % PRED: 161 %
RV: 3.98 L
TLC % PRED: 99 %
TLC: 7.06 L

## 2016-07-09 MED ORDER — FLUTICASONE-UMECLIDIN-VILANT 100-62.5-25 MCG/INH IN AEPB: 1.0000 | INHALATION_SPRAY | Freq: Every day | RESPIRATORY_TRACT | 6 refills | Status: DC

## 2016-07-09 NOTE — Progress Notes (Signed)
George Andersen    [id number]    01/14/1946  Primary Care Physician:Daniel, Mitzie Na, MD  Referring Physician: Caryl Bis, MD [place], [place] [address]  Chief complaint:  Follow up for COPD GOLD D  HPI: George Andersen is 71 year old with past medical history of COPD GOLD D (CAT score 26, multiple exacebations), peripheral artery disease, depression, panic attack, hyperlipidemia, hypertension, GERD. He was admitted to [facility] in [place] from 1/29 to 03/06/16 with acute hypercapnic and hypoxic respiratory failure, bronchitis, COPD exacerbation. He was treated with antibiotics [Doxy plus Levaquin], IV steroids, nebulizers. . Chest x-ray during this admission which did not show any infiltrates but hyperinflation. He also had a spirometry which showed FEV1 of 16% of predicted. ABG was 7.44/49/83.  He is on albuterol nebs, pro-air and Pulmicort nebulizers. He was also on chronic prednisone at 10 mg. This dose of prednisone was increased at this admission and he appears to be currently on 20 mg but is not sure of the exact dose. He had tried Symbicort in the past but felt it did not help her symptoms. He has chronic dyspnea on exertion, cough with mucus production, wheezing. He denies any fevers, chills. He has about 20-pack-year smoking history. He quit in 1990. There is no family history of lung problems.  Interim History: He could not afford the bevespi because of the cost . He is maintained on anoro and pulmocort nebs. He reports that his breathing is slightly worse compared to last visit. He has chronic dyspnea on exertion, cough with sputum production. Denies any fevers, chills.  Outpatient Encounter Prescriptions as of 07/09/2016  Medication Sig  . ALPRAZolam (XANAX) 0.5 MG tablet Take 0.5 mg by mouth at bedtime as needed for anxiety.  [name] amLODipine (NORVASC) 10 MG tablet TK 1 T PO QD  . DULoxetine (CYMBALTA) 20 MG capsule Take 20 mg by mouth daily.  .  hydrocortisone (CORTEF) 20 MG tablet   . ipratropium-albuterol (DUONEB) 0.5-2.5 (3) MG/3ML SOLN INHALE THE CONTENTS OF 1 VIAL VIA NEBULIZER FOUR TIMES A DAY  . montelukast (SINGULAIR) 10 MG tablet TK 1 T PO QD  . Oxycodone HCl 20 MG TABS   . predniSONE (DELTASONE) 10 MG tablet Take 10 mg by mouth daily with breakfast.  . simvastatin (ZOCOR) 40 MG tablet TK 1 T PO  QD FOR HIGH CHOLESTEROL  . umeclidinium-vilanterol (ANORO ELLIPTA) 62.5-25 MCG/INH AEPB Inhale 1 puff into the lungs daily.  . VENTOLIN HFA 108 (90 Base) MCG/ACT inhaler   . [DISCONTINUED] doxycycline (DORYX) 100 MG EC tablet Take 100 mg by mouth 2 (two) times daily.  . [DISCONTINUED] Glycopyrrolate-Formoterol (BEVESPI AEROSPHERE) 9-4.8 MCG/ACT AERO Inhale 2 puffs into the lungs 2 (two) times daily.  . [DISCONTINUED] losartan-hydrochlorothiazide (HYZAAR) 100-12.5 MG tablet Take 1 tablet by mouth daily.  . [DISCONTINUED] predniSONE (DELTASONE) 20 MG tablet Take 20 mg by mouth daily with breakfast.   No facility-administered encounter medications on file as of 07/09/2016.     Allergies as of 07/09/2016  . (Not on File)    Past Medical History:  Diagnosis Date  . COPD (chronic obstructive pulmonary disease) ([place])   . Emphysema lung ([place])     Past Surgical History:  Procedure Laterality Date  . SHOULDER SURGERY      Family History  Problem Relation Age of Onset  . Heart attack Mother   . Throat cancer Father     Social History  Social History  . Marital status: Married    Spouse name: N/A  . Number of children: N/A  . Years of education: N/A   Occupational History  . Not on file.   Social History Main Topics  . Smoking status: Former Smoker    Packs/day: 1.00    Years: 20.00    Types: Cigarettes  . Smokeless tobacco: Never Used     Comment: quit smoking in 1990  . Alcohol use Yes     Comment: occ  . Drug use: Yes    Types: Hashish  . Sexual activity: Not on file   Other Topics Concern  . Not on file     Social History Narrative  . No narrative on file    Review of systems: Review of Systems  Constitutional: Negative for fever and chills.  HENT: Negative.   Eyes: Negative for blurred vision.  Respiratory: as per HPI  Cardiovascular: Negative for chest pain and palpitations.  Gastrointestinal: Negative for vomiting, diarrhea, blood per rectum. Genitourinary: Negative for dysuria, urgency, frequency and hematuria.  Musculoskeletal: Negative for myalgias, back pain and joint pain.  Skin: Negative for itching and rash.  Neurological: Negative for dizziness, tremors, focal weakness, seizures and loss of consciousness.  Endo/Heme/Allergies: Negative for environmental allergies.  Psychiatric/Behavioral: Negative for depression, suicidal ideas and hallucinations.  All other systems reviewed and are negative.  Physical Exam: Blood pressure (!) 160/92, pulse (!) 102, height 5\' 11"  (1.803 m), weight 236 lb (107 kg), SpO2 96 %. Gen:      No acute distress HEENT:  EOMI, sclera anicteric Neck:     No masses; no thyromegaly Lungs:    Clear to auscultation bilaterally; normal respiratory effort CV:         Regular rate and rhythm; no murmurs Abd:      + bowel sounds; soft, non-tender; no palpable masses, no distension Ext:    No edema; adequate peripheral perfusion Skin:      Warm and dry; no rash Neuro: alert and oriented x 3 Psych: normal mood and affect  Data Reviewed: CXR 12/22/07- no active disease  A1AT 03/11/16- 130, PIMM phenotype.  PFTs 07/09/16 FVC 2.64 [67%) FEV1 1.39 [46%) F/F 53 TLC 99% DLCO 33% Severe obstruction, severe diffusion defect. Airtrapping  Assessment:  COPD GOLD D PFTs reviewed with him today. There is severe obstruction consistent with COPD. I think we can futher optimize his inhalers with trelegy instead of anoro and pulmicort nebs. He will continue on albuterol PRN and supplemental O2. He continues on chronic prednisone at 10 mg. Reassess at next visit if  we can try weaning it off.  He will benefit from pulmonary rehab but is concerned about the cost.   Plan/Recommendations: - Start trelegy instead of anoro and pulmicort.  - Continue prednisone 10 mg  - Continue Home O2 with ambulation and at night  Marshell Garfinkel MD [place] [facility] Pager [phone] 07/09/2016, 2:26 PM  CC: Caryl Bis, MD

## 2016-07-09 NOTE — Patient Instructions (Signed)
We will stop the anoro and Pulmicort nebulizer We will start trelegy inhaler Continue the albuterol   Return in 6 months

## 2016-07-09 NOTE — Progress Notes (Signed)
PFT done today. 

## 2016-07-20 ENCOUNTER — Telehealth: Payer: Self-pay

## 2016-07-20 NOTE — Telephone Encounter (Signed)
Started PA on covermymeds for Trelegy, awaiting response from [employer].  George Andersen (Key: [id number])   Forward to [facility] for follow up.

## 2016-07-22 NOTE — Telephone Encounter (Signed)
Checked CMM. PA has been denied.  Will called insurance company on 07/23/16 for alternatives.

## 2016-07-23 NOTE — Telephone Encounter (Signed)
PA for Trelegy was denied. Covered alternatives are Breo, Incruse or Striverdi.  PM please advise. Thanks.

## 2016-07-29 MED ORDER — UMECLIDINIUM BROMIDE 62.5 MCG/INH IN AEPB: 1.0000 | INHALATION_SPRAY | Freq: Every day | RESPIRATORY_TRACT | 5 refills | Status: DC

## 2016-07-29 MED ORDER — FLUTICASONE FUROATE-VILANTEROL 200-25 MCG/INH IN AEPB: 1.0000 | INHALATION_SPRAY | Freq: Every day | RESPIRATORY_TRACT | 5 refills | Status: DC

## 2016-07-29 NOTE — Telephone Encounter (Signed)
Order both breo 200 and incruse.  Marshell Garfinkel MD

## 2016-07-29 NOTE — Telephone Encounter (Signed)
Called spoke with patient and discussed with him about insurance not covering the Trelegy and changing this to [place] and Incruse.  We did discuss this x10 minutes to ensure full understanding.  Pt aware that the Breo and Incruse -the device- worke exactly the same as the Anoro that he was taking and that each inhaler should be taken 1 puff once daily, back-to-back is okay.  Pt is aware to brush, rinse and gargle after each use.  Pt voiced his understanding and denied any further questions/concerns. Called [facility] to speak with pharmacist Myriam Jacobson and informed him that the Trelegy PA was denied and we are changing to Breo 200 and Incruse.  Myriam Jacobson asked that the Rx's be sent eletronically  Rx's sent to verified pharmacy Med list updated Nothing further needed; will sign off.

## 2016-08-03 DIAGNOSIS — J208 Acute bronchitis due to other specified organisms: Secondary | ICD-10-CM | POA: Diagnosis not present

## 2016-08-03 DIAGNOSIS — J440 Chronic obstructive pulmonary disease with acute lower respiratory infection: Secondary | ICD-10-CM | POA: Diagnosis not present

## 2016-08-03 DIAGNOSIS — Z6832 Body mass index (BMI) 32.0-32.9, adult: Secondary | ICD-10-CM | POA: Diagnosis not present

## 2016-08-27 DIAGNOSIS — I10 Essential (primary) hypertension: Secondary | ICD-10-CM | POA: Diagnosis not present

## 2016-08-27 DIAGNOSIS — J449 Chronic obstructive pulmonary disease, unspecified: Secondary | ICD-10-CM | POA: Diagnosis not present

## 2016-08-27 DIAGNOSIS — E038 Other specified hypothyroidism: Secondary | ICD-10-CM | POA: Diagnosis not present

## 2016-08-27 DIAGNOSIS — Z6832 Body mass index (BMI) 32.0-32.9, adult: Secondary | ICD-10-CM | POA: Diagnosis not present

## 2016-08-27 DIAGNOSIS — M545 Low back pain: Secondary | ICD-10-CM | POA: Diagnosis not present

## 2016-08-27 DIAGNOSIS — E782 Mixed hyperlipidemia: Secondary | ICD-10-CM | POA: Diagnosis not present

## 2016-08-27 DIAGNOSIS — C61 Malignant neoplasm of prostate: Secondary | ICD-10-CM | POA: Diagnosis not present

## 2016-08-27 DIAGNOSIS — I739 Peripheral vascular disease, unspecified: Secondary | ICD-10-CM | POA: Diagnosis not present

## 2016-10-07 ENCOUNTER — Telehealth: Payer: Self-pay | Admitting: Pulmonary Disease

## 2016-10-07 MED ORDER — FLUTICASONE FUROATE-VILANTEROL 200-25 MCG/INH IN AEPB: 1.0000 | INHALATION_SPRAY | Freq: Every day | RESPIRATORY_TRACT | 0 refills | Status: DC

## 2016-10-07 NOTE — Telephone Encounter (Signed)
Spoke with pt, states that he is in the donut hole and cannot afford breo and incruse.  Advised pt that I would place samples of breo up front (no incruse samples at this time), and place pt assistance forms up front for completion.  Pt and wife expressed understanding.  Nothing further needed at this time.

## 2016-11-02 ENCOUNTER — Telehealth: Payer: Self-pay | Admitting: Pulmonary Disease

## 2016-11-02 NOTE — Telephone Encounter (Signed)
Spoke with pt's spouse, states that pt accidentally took 2 puffs of Breo instead of prescribed 1 puff of Breo today.  Pt denies any s/s after taking duplicate dose.  I reviewed proper inhaler usage with pt and spouse, advised pt to resume normal dosage and call if he has any worsening s/s.  Pt and spouse expressed understanding.  Nothing further needed.

## 2016-11-18 DIAGNOSIS — E7849 Other hyperlipidemia: Secondary | ICD-10-CM | POA: Diagnosis not present

## 2016-11-18 DIAGNOSIS — G6289 Other specified polyneuropathies: Secondary | ICD-10-CM | POA: Diagnosis not present

## 2016-11-18 DIAGNOSIS — I10 Essential (primary) hypertension: Secondary | ICD-10-CM | POA: Diagnosis not present

## 2016-11-18 DIAGNOSIS — E782 Mixed hyperlipidemia: Secondary | ICD-10-CM | POA: Diagnosis not present

## 2016-11-18 DIAGNOSIS — Z9189 Other specified personal risk factors, not elsewhere classified: Secondary | ICD-10-CM | POA: Diagnosis not present

## 2016-11-18 DIAGNOSIS — J440 Chronic obstructive pulmonary disease with acute lower respiratory infection: Secondary | ICD-10-CM | POA: Diagnosis not present

## 2016-11-18 DIAGNOSIS — J449 Chronic obstructive pulmonary disease, unspecified: Secondary | ICD-10-CM | POA: Diagnosis not present

## 2016-11-18 DIAGNOSIS — I739 Peripheral vascular disease, unspecified: Secondary | ICD-10-CM | POA: Diagnosis not present

## 2016-11-18 DIAGNOSIS — K219 Gastro-esophageal reflux disease without esophagitis: Secondary | ICD-10-CM | POA: Diagnosis not present

## 2016-11-18 DIAGNOSIS — J9611 Chronic respiratory failure with hypoxia: Secondary | ICD-10-CM | POA: Diagnosis not present

## 2016-11-19 DIAGNOSIS — Z23 Encounter for immunization: Secondary | ICD-10-CM | POA: Diagnosis not present

## 2016-11-19 DIAGNOSIS — Z6832 Body mass index (BMI) 32.0-32.9, adult: Secondary | ICD-10-CM | POA: Diagnosis not present

## 2016-11-19 DIAGNOSIS — Z0001 Encounter for general adult medical examination with abnormal findings: Secondary | ICD-10-CM | POA: Diagnosis not present

## 2016-11-19 DIAGNOSIS — I10 Essential (primary) hypertension: Secondary | ICD-10-CM | POA: Diagnosis not present

## 2016-11-23 DIAGNOSIS — I10 Essential (primary) hypertension: Secondary | ICD-10-CM | POA: Diagnosis not present

## 2016-11-23 DIAGNOSIS — E038 Other specified hypothyroidism: Secondary | ICD-10-CM | POA: Diagnosis not present

## 2016-11-23 DIAGNOSIS — I739 Peripheral vascular disease, unspecified: Secondary | ICD-10-CM | POA: Diagnosis not present

## 2016-11-23 DIAGNOSIS — J9611 Chronic respiratory failure with hypoxia: Secondary | ICD-10-CM | POA: Diagnosis not present

## 2016-11-23 DIAGNOSIS — E782 Mixed hyperlipidemia: Secondary | ICD-10-CM | POA: Diagnosis not present

## 2016-11-23 DIAGNOSIS — C61 Malignant neoplasm of prostate: Secondary | ICD-10-CM | POA: Diagnosis not present

## 2016-11-23 DIAGNOSIS — J449 Chronic obstructive pulmonary disease, unspecified: Secondary | ICD-10-CM | POA: Diagnosis not present

## 2016-11-23 DIAGNOSIS — Z1212 Encounter for screening for malignant neoplasm of rectum: Secondary | ICD-10-CM | POA: Diagnosis not present

## 2017-01-11 ENCOUNTER — Ambulatory Visit: Payer: Medicare Other | Admitting: Pulmonary Disease

## 2017-02-17 ENCOUNTER — Ambulatory Visit: Payer: Medicare Other | Admitting: Pulmonary Disease

## 2017-02-18 DIAGNOSIS — G6289 Other specified polyneuropathies: Secondary | ICD-10-CM | POA: Diagnosis not present

## 2017-02-18 DIAGNOSIS — C61 Malignant neoplasm of prostate: Secondary | ICD-10-CM | POA: Diagnosis not present

## 2017-02-18 DIAGNOSIS — J301 Allergic rhinitis due to pollen: Secondary | ICD-10-CM | POA: Diagnosis not present

## 2017-02-18 DIAGNOSIS — J9611 Chronic respiratory failure with hypoxia: Secondary | ICD-10-CM | POA: Diagnosis not present

## 2017-02-18 DIAGNOSIS — Z6832 Body mass index (BMI) 32.0-32.9, adult: Secondary | ICD-10-CM | POA: Diagnosis not present

## 2017-02-18 DIAGNOSIS — E782 Mixed hyperlipidemia: Secondary | ICD-10-CM | POA: Diagnosis not present

## 2017-02-18 DIAGNOSIS — Z1212 Encounter for screening for malignant neoplasm of rectum: Secondary | ICD-10-CM | POA: Diagnosis not present

## 2017-02-18 DIAGNOSIS — M545 Low back pain: Secondary | ICD-10-CM | POA: Diagnosis not present

## 2017-02-18 DIAGNOSIS — E038 Other specified hypothyroidism: Secondary | ICD-10-CM | POA: Diagnosis not present

## 2017-02-18 DIAGNOSIS — J449 Chronic obstructive pulmonary disease, unspecified: Secondary | ICD-10-CM | POA: Diagnosis not present

## 2017-02-18 DIAGNOSIS — I739 Peripheral vascular disease, unspecified: Secondary | ICD-10-CM | POA: Diagnosis not present

## 2017-02-18 DIAGNOSIS — I10 Essential (primary) hypertension: Secondary | ICD-10-CM | POA: Diagnosis not present

## 2017-03-01 ENCOUNTER — Ambulatory Visit (INDEPENDENT_AMBULATORY_CARE_PROVIDER_SITE_OTHER)
Admission: RE | Admit: 2017-03-01 | Discharge: 2017-03-01 | Disposition: A | Discharge status: Home / Self Care | Payer: Medicare Other | Source: Ambulatory Visit | Attending: Pulmonary Disease | Admitting: Pulmonary Disease

## 2017-03-01 ENCOUNTER — Encounter: Payer: Self-pay | Admitting: Pulmonary Disease

## 2017-03-01 ENCOUNTER — Ambulatory Visit: Payer: Medicare Other | Admitting: Pulmonary Disease

## 2017-03-01 ENCOUNTER — Ambulatory Visit (INDEPENDENT_AMBULATORY_CARE_PROVIDER_SITE_OTHER): Payer: Medicare Other | Admitting: Pulmonary Disease

## 2017-03-01 VITALS — BP 136/80 | HR 99 | Ht 71.0 in | Wt 236.2 lb

## 2017-03-01 DIAGNOSIS — R0602 Shortness of breath: Secondary | ICD-10-CM | POA: Diagnosis not present

## 2017-03-01 DIAGNOSIS — J449 Chronic obstructive pulmonary disease, unspecified: Secondary | ICD-10-CM | POA: Diagnosis not present

## 2017-03-01 MED ORDER — FLUTICASONE FUROATE-VILANTEROL 200-25 MCG/INH IN AEPB: 1.0000 | INHALATION_SPRAY | Freq: Every day | RESPIRATORY_TRACT | 0 refills | Status: DC

## 2017-03-01 NOTE — Addendum Note (Signed)
Addended by: Maryanna Shape A on: 03/01/2017 03:02 PM   Modules accepted: Orders

## 2017-03-01 NOTE — Addendum Note (Signed)
Addended by: Elton Sin on: 03/01/2017 03:15 PM   Modules accepted: Orders

## 2017-03-01 NOTE — Progress Notes (Signed)
George Andersen    [id number]    1945/05/12  Primary Care Physician:Daniel, Mitzie Na, MD  Referring Physician: Caryl Bis, MD [place], [place] [address]  Chief complaint:  Follow up for COPD GOLD D  HPI: [name] is 72 year old with past medical history of COPD GOLD D (CAT score 26, multiple exacebations), peripheral artery disease, depression, panic attack, hyperlipidemia, hypertension, GERD. He was admitted to [facility] in [place] from 1/29 to 03/06/16 with acute hypercapnic and hypoxic respiratory failure, bronchitis, COPD exacerbation. He was treated with antibiotics [Doxy plus Levaquin], IV steroids, nebulizers. . Chest x-ray during this admission which did not show any infiltrates but hyperinflation. He also had a spirometry which showed FEV1 of 16% of predicted. ABG was 7.44/49/83.  He is on albuterol nebs, pro-air and Pulmicort nebulizers. He was also on chronic prednisone at 10 mg. This dose of prednisone was increased at this admission and he appears to be currently on 20 mg but is not sure of the exact dose. He had tried Symbicort in the past but felt it did not help her symptoms. He has chronic dyspnea on exertion, cough with mucus production, wheezing. He denies any fevers, chills. He has about 20-pack-year smoking history. He quit in 1990. There is no family history of lung problems.  Interim History: Continues on Breo and incruse.  Trelegy was not covered by insurance.  He reports slightly worse dyspnea on exertion compared to last visit.  No exacerbations or ER visits.  Outpatient Encounter Medications as of 03/01/2017  Medication Sig  . ALPRAZolam (XANAX) 0.5 MG tablet Take 0.5 mg by mouth at bedtime as needed for anxiety.  [name] amLODipine (NORVASC) 10 MG tablet TK 1 T PO QD  . DULoxetine (CYMBALTA) 20 MG capsule Take 20 mg by mouth daily.  . fluticasone furoate-vilanterol (BREO ELLIPTA) 200-25 MCG/INH AEPB Inhale 1 puff into the lungs  daily.  [name] ipratropium-albuterol (DUONEB) 0.5-2.5 (3) MG/3ML SOLN INHALE THE CONTENTS OF 1 VIAL VIA NEBULIZER FOUR TIMES A DAY  . montelukast (SINGULAIR) 10 MG tablet TK 1 T PO QD  . Oxycodone HCl 20 MG TABS   . predniSONE (DELTASONE) 10 MG tablet Take 10 mg by mouth daily with breakfast.  . simvastatin (ZOCOR) 40 MG tablet TK 1 T PO  QD FOR HIGH CHOLESTEROL  . umeclidinium bromide (INCRUSE ELLIPTA) 62.5 MCG/INH AEPB Inhale 1 puff into the lungs daily.  . VENTOLIN HFA 108 (90 Base) MCG/ACT inhaler   . [DISCONTINUED] fluticasone furoate-vilanterol (BREO ELLIPTA) 200-25 MCG/INH AEPB Inhale 1 puff into the lungs daily.  . [DISCONTINUED] hydrocortisone (CORTEF) 20 MG tablet    No facility-administered encounter medications on file as of 03/01/2017.     Allergies as of 03/01/2017 - Review Complete 03/01/2017  Allergen Reaction Noted  . Acetaminophen  03/01/2017  . Gabapentin  03/01/2017    Past Medical History:  Diagnosis Date  . COPD (chronic obstructive pulmonary disease) ([place])   . Emphysema lung ([place])     Past Surgical History:  Procedure Laterality Date  . SHOULDER SURGERY      Family History  Problem Relation Age of Onset  . Heart attack Mother   . Throat cancer Father     Social History   Socioeconomic History  . Marital status: Married    Spouse name: Not on file  . Number of children: Not on file  . Years of education: Not on file  .  Highest education level: Not on file  Social Needs  . Financial resource strain: Not on file  . Food insecurity - worry: Not on file  . Food insecurity - inability: Not on file  . Transportation needs - medical: Not on file  . Transportation needs - non-medical: Not on file  Occupational History  . Not on file  Tobacco Use  . Smoking status: Former Smoker    Packs/day: 1.00    Years: 20.00    Pack years: 20.00    Types: Cigarettes  . Smokeless tobacco: Never Used  . Tobacco comment: quit smoking in 1990  Substance and Sexual  Activity  . Alcohol use: Yes    Comment: occ  . Drug use: Yes    Types: Hashish  . Sexual activity: Not on file  Other Topics Concern  . Not on file  Social History Narrative  . Not on file    Review of systems: Review of Systems  Constitutional: Negative for fever and chills.  HENT: Negative.   Eyes: Negative for blurred vision.  Respiratory: as per HPI  Cardiovascular: Negative for chest pain and palpitations.  Gastrointestinal: Negative for vomiting, diarrhea, blood per rectum. Genitourinary: Negative for dysuria, urgency, frequency and hematuria.  Musculoskeletal: Negative for myalgias, back pain and joint pain.  Skin: Negative for itching and rash.  Neurological: Negative for dizziness, tremors, focal weakness, seizures and loss of consciousness.  Endo/Heme/Allergies: Negative for environmental allergies.  Psychiatric/Behavioral: Negative for depression, suicidal ideas and hallucinations.  All other systems reviewed and are negative.  Physical Exam: Blood pressure 136/80, pulse 99, height 5\' 11"  (1.803 m), weight 236 lb 3.2 oz (107.1 kg), SpO2 95 %. Gen:      No acute distress HEENT:  EOMI, sclera anicteric Neck:     No masses; no thyromegaly Lungs:    Clear to auscultation bilaterally; normal respiratory effort CV:         Regular rate and rhythm; no murmurs Abd:      + bowel sounds; soft, non-tender; no palpable masses, no distension Ext:    No edema; adequate peripheral perfusion Skin:      Warm and dry; no rash Neuro: alert and oriented x 3 Psych: normal mood and affect  Data Reviewed: CXR 12/22/07- no active disease  A1AT 03/11/16- 130, PIMM phenotype.  PFTs 07/09/16 FVC 2.64 [67%), FEV1 1.39 [46%), F/F 53, TLC 99%, DLCO 33% Severe obstruction, severe diffusion defect. [employer]  Assessment:  COPD GOLD D PFTs reviewed with him today. There is severe obstruction consistent with COPD. Trelegy is not covered by insurance.  He is continues on Breo and  incruse He continues on chronic prednisone at 10 mg. Reassess at next visit if we can try weaning it off.  He will benefit from pulmonary rehab but is concerned about the cost.   Plan/Recommendations: - Continue Brio and incruse - Reduce prednisone dose to 7.5 mg - Home O2 with ambulation and at night - Chest x-ray today  Marshell Garfinkel MD [facility] Pager [phone] 03/01/2017, 2:43 PM  CC: George Bis, MD

## 2017-03-01 NOTE — Patient Instructions (Signed)
We will get chest x-ray today Will give samples of inhalers if you have any Continue on prednisone.  Will reduce dose to 7.5 mg a day.  Please call us if there is any worsening of your symptoms on reduced dose of prednisone Follow-up in 3-4 months.

## 2017-04-05 ENCOUNTER — Other Ambulatory Visit: Payer: Self-pay

## 2017-04-05 MED ORDER — UMECLIDINIUM BROMIDE 62.5 MCG/INH IN AEPB: 1.0000 | INHALATION_SPRAY | Freq: Every day | RESPIRATORY_TRACT | 5 refills | Status: DC

## 2017-04-12 ENCOUNTER — Other Ambulatory Visit: Payer: Self-pay

## 2017-04-12 MED ORDER — FLUTICASONE FUROATE-VILANTEROL 200-25 MCG/INH IN AEPB: 1.0000 | INHALATION_SPRAY | Freq: Every day | RESPIRATORY_TRACT | 5 refills | Status: DC

## 2017-04-12 NOTE — Telephone Encounter (Signed)
Received Rx refill for Breo 200 from [facility] on [address]. Rx has been sent to preferred pharmacy. Nothing further is needed.

## 2017-04-18 DIAGNOSIS — J440 Chronic obstructive pulmonary disease with acute lower respiratory infection: Secondary | ICD-10-CM | POA: Diagnosis not present

## 2017-04-18 DIAGNOSIS — J208 Acute bronchitis due to other specified organisms: Secondary | ICD-10-CM | POA: Diagnosis not present

## 2017-04-18 DIAGNOSIS — Z6832 Body mass index (BMI) 32.0-32.9, adult: Secondary | ICD-10-CM | POA: Diagnosis not present

## 2017-04-25 DIAGNOSIS — J9611 Chronic respiratory failure with hypoxia: Secondary | ICD-10-CM | POA: Diagnosis not present

## 2017-04-25 DIAGNOSIS — Z6832 Body mass index (BMI) 32.0-32.9, adult: Secondary | ICD-10-CM | POA: Diagnosis not present

## 2017-04-25 DIAGNOSIS — J440 Chronic obstructive pulmonary disease with acute lower respiratory infection: Secondary | ICD-10-CM | POA: Diagnosis not present

## 2017-05-11 DIAGNOSIS — I10 Essential (primary) hypertension: Secondary | ICD-10-CM | POA: Diagnosis not present

## 2017-05-11 DIAGNOSIS — J301 Allergic rhinitis due to pollen: Secondary | ICD-10-CM | POA: Diagnosis not present

## 2017-05-11 DIAGNOSIS — Z9189 Other specified personal risk factors, not elsewhere classified: Secondary | ICD-10-CM | POA: Diagnosis not present

## 2017-05-11 DIAGNOSIS — Z6831 Body mass index (BMI) 31.0-31.9, adult: Secondary | ICD-10-CM | POA: Diagnosis not present

## 2017-05-11 DIAGNOSIS — M545 Low back pain: Secondary | ICD-10-CM | POA: Diagnosis not present

## 2017-05-11 DIAGNOSIS — J9611 Chronic respiratory failure with hypoxia: Secondary | ICD-10-CM | POA: Diagnosis not present

## 2017-05-11 DIAGNOSIS — Z1212 Encounter for screening for malignant neoplasm of rectum: Secondary | ICD-10-CM | POA: Diagnosis not present

## 2017-05-11 DIAGNOSIS — I739 Peripheral vascular disease, unspecified: Secondary | ICD-10-CM | POA: Diagnosis not present

## 2017-05-11 DIAGNOSIS — C61 Malignant neoplasm of prostate: Secondary | ICD-10-CM | POA: Diagnosis not present

## 2017-05-11 DIAGNOSIS — E782 Mixed hyperlipidemia: Secondary | ICD-10-CM | POA: Diagnosis not present

## 2017-05-11 DIAGNOSIS — Z79891 Long term (current) use of opiate analgesic: Secondary | ICD-10-CM | POA: Diagnosis not present

## 2017-05-11 DIAGNOSIS — G6289 Other specified polyneuropathies: Secondary | ICD-10-CM | POA: Diagnosis not present

## 2017-05-11 DIAGNOSIS — J449 Chronic obstructive pulmonary disease, unspecified: Secondary | ICD-10-CM | POA: Diagnosis not present

## 2017-05-11 DIAGNOSIS — Z1389 Encounter for screening for other disorder: Secondary | ICD-10-CM | POA: Diagnosis not present

## 2017-05-11 DIAGNOSIS — E038 Other specified hypothyroidism: Secondary | ICD-10-CM | POA: Diagnosis not present

## 2017-05-30 ENCOUNTER — Ambulatory Visit (INDEPENDENT_AMBULATORY_CARE_PROVIDER_SITE_OTHER): Payer: Medicare Other | Admitting: Pulmonary Disease

## 2017-05-30 ENCOUNTER — Encounter: Payer: Self-pay | Admitting: Pulmonary Disease

## 2017-05-30 VITALS — BP 160/90 | HR 120 | Ht 71.0 in | Wt 229.5 lb

## 2017-05-30 DIAGNOSIS — J449 Chronic obstructive pulmonary disease, unspecified: Secondary | ICD-10-CM | POA: Diagnosis not present

## 2017-05-30 MED ORDER — FLUTICASONE FUROATE-VILANTEROL 200-25 MCG/INH IN AEPB: 1.0000 | INHALATION_SPRAY | Freq: Every day | RESPIRATORY_TRACT | 0 refills | Status: AC

## 2017-05-30 NOTE — Patient Instructions (Signed)
I am glad you are doing well We will see if you have samples of your inhaler to give you today Follow-up in 6 months.

## 2017-05-30 NOTE — Addendum Note (Signed)
Addended by: Maryanna Shape A on: 05/30/2017 03:26 PM   Modules accepted: Orders

## 2017-05-30 NOTE — Progress Notes (Signed)
George Andersen    [id number]    12-27-1945  Primary Care Physician:Daniel, Mitzie Na, MD  Referring Physician: Caryl Bis, MD [place], [place] [address]  Chief complaint:  Follow up for COPD GOLD D  HPI: George Andersen is 72 year old with past medical history of COPD GOLD D (CAT score 26, multiple exacebations), peripheral artery disease, depression, panic attack, hyperlipidemia, hypertension, GERD. He was admitted to [facility] in [place] from 1/29 to 03/06/16 with acute hypercapnic and hypoxic respiratory failure, bronchitis, COPD exacerbation. He was treated with antibiotics [Doxy plus Levaquin], IV steroids, nebulizers. . Chest x-ray during this admission which did not show any infiltrates but hyperinflation. He also had a spirometry which showed FEV1 of 16% of predicted. ABG was 7.44/49/83.  He is on albuterol nebs, pro-air and Pulmicort nebulizers. He was also on chronic prednisone at 10 mg. This dose of prednisone was increased at this admission and he appears to be currently on 20 mg but is not sure of the exact dose. He had tried Symbicort in the past but felt it did not help her symptoms. He has chronic dyspnea on exertion, cough with mucus production, wheezing. He denies any fevers, chills. He has about 20-pack-year smoking history. He quit in 1990. There is no family history of lung problems.  Interim History: Continues on Breo and incruse.  Trelegy was not covered by insurance. States that dyspnea is stable.  He has some issues with hot humid days otherwise is doing okay He has a portable concentrator ordered by his primary care and is working on getting it.  Outpatient Encounter Medications as of 05/30/2017  Medication Sig  . ALPRAZolam (XANAX) 0.5 MG tablet Take 0.5 mg by mouth at bedtime as needed for anxiety.  [name] amLODipine (NORVASC) 10 MG tablet TK 1 T PO QD  . DULoxetine (CYMBALTA) 20 MG capsule Take 20 mg by mouth daily.  . fluticasone  furoate-vilanterol (BREO ELLIPTA) 200-25 MCG/INH AEPB Inhale 1 puff into the lungs daily.  [name] ipratropium-albuterol (DUONEB) 0.5-2.5 (3) MG/3ML SOLN INHALE THE CONTENTS OF 1 VIAL VIA NEBULIZER FOUR TIMES A DAY  . montelukast (SINGULAIR) 10 MG tablet TK 1 T PO QD  . Oxycodone HCl 20 MG TABS   . predniSONE (DELTASONE) 10 MG tablet Take 5 mg by mouth daily with breakfast.   . simvastatin (ZOCOR) 40 MG tablet TK 1 T PO  QD FOR HIGH CHOLESTEROL  . umeclidinium bromide (INCRUSE ELLIPTA) 62.5 MCG/INH AEPB Inhale 1 puff into the lungs daily.  . VENTOLIN HFA 108 (90 Base) MCG/ACT inhaler   . [DISCONTINUED] fluticasone furoate-vilanterol (BREO ELLIPTA) 200-25 MCG/INH AEPB Inhale 1 puff into the lungs daily.   No facility-administered encounter medications on file as of 05/30/2017.     Allergies as of 05/30/2017 - Review Complete 05/30/2017  Allergen Reaction Noted  . Acetaminophen  03/01/2017  . Gabapentin  03/01/2017    Past Medical History:  Diagnosis Date  . COPD (chronic obstructive pulmonary disease) ([place])   . Emphysema lung ([place])     Past Surgical History:  Procedure Laterality Date  . SHOULDER SURGERY      Family History  Problem Relation Age of Onset  . Heart attack Mother   . Throat cancer Father     Social History   Socioeconomic History  . Marital status: Married    Spouse name: Not on file  . Number of children: Not on file  .  Years of education: Not on file  . Highest education level: Not on file  Occupational History  . Not on file  Social Needs  . Financial resource strain: Not on file  . Food insecurity:    Worry: Not on file    Inability: Not on file  . Transportation needs:    Medical: Not on file    Non-medical: Not on file  Tobacco Use  . Smoking status: Former Smoker    Packs/day: 1.00    Years: 20.00    Pack years: 20.00    Types: Cigarettes  . Smokeless tobacco: Never Used  . Tobacco comment: quit smoking in 1990  Substance and Sexual Activity    . Alcohol use: Yes    Comment: occ  . Drug use: Yes    Types: Hashish  . Sexual activity: Not on file  Lifestyle  . Physical activity:    Days per week: Not on file    Minutes per session: Not on file  . Stress: Not on file  Relationships  . Social connections:    Talks on phone: Not on file    Gets together: Not on file    Attends religious service: Not on file    Active member of club or organization: Not on file    Attends meetings of clubs or organizations: Not on file    Relationship status: Not on file  . Intimate partner violence:    Fear of current or ex partner: Not on file    Emotionally abused: Not on file    Physically abused: Not on file    Forced sexual activity: Not on file  Other Topics Concern  . Not on file  Social History Narrative  . Not on file    Review of systems: Review of Systems  Constitutional: Negative for fever and chills.  HENT: Negative.   Eyes: Negative for blurred vision.  Respiratory: as per HPI  Cardiovascular: Negative for chest pain and palpitations.  Gastrointestinal: Negative for vomiting, diarrhea, blood per rectum. Genitourinary: Negative for dysuria, urgency, frequency and hematuria.  Musculoskeletal: Negative for myalgias, back pain and joint pain.  Skin: Negative for itching and rash.  Neurological: Negative for dizziness, tremors, focal weakness, seizures and loss of consciousness.  Endo/Heme/Allergies: Negative for environmental allergies.  Psychiatric/Behavioral: Negative for depression, suicidal ideas and hallucinations.  All other systems reviewed and are negative.  Physical Exam: Blood pressure 136/80, pulse 99, height 5\' 11"  (1.803 m), weight 236 lb 3.2 oz (107.1 kg), SpO2 95 %. Gen:      No acute distress HEENT:  EOMI, sclera anicteric Neck:     No masses; no thyromegaly Lungs:    Clear to auscultation bilaterally; normal respiratory effort CV:         Regular rate and rhythm; no murmurs Abd:      + bowel  sounds; soft, non-tender; no palpable masses, no distension Ext:    No edema; adequate peripheral perfusion Skin:      Warm and dry; no rash Neuro: alert and oriented x 3 Psych: normal mood and affect  Data Reviewed: Chest x-ray 12/22/07- no active disease  Chest x-ray 03/01/2017- chronic COPD changes. No acute process I have reviewed the images personally.  A1AT 03/11/16- 130, PIMM phenotype.  PFTs 07/09/16 FVC 2.64 [67%), FEV1 1.39 [46%), F/F 53, TLC 99%, DLCO 33% Severe obstruction, severe diffusion defect. [employer]  Assessment:  COPD GOLD D Continues on Breo and incruse He continues on chronic prednisone.  This  is being weaned slowly.  He is currently at 5 mg now Discussed pulmonary rehab.  He will benefit from going to the program but he is concerned about the cost. We will readdress this at next visit.  Plan/Recommendations: - Continue Breo and incruse - Reduce prednisone dose to 5 mg - Home O2 with ambulation and at night  Marshell Garfinkel MD [facility] Pager [phone] 05/30/2017, 3:04 PM  CC: Caryl Bis, MD

## 2017-08-08 DIAGNOSIS — C61 Malignant neoplasm of prostate: Secondary | ICD-10-CM | POA: Diagnosis not present

## 2017-08-08 DIAGNOSIS — E782 Mixed hyperlipidemia: Secondary | ICD-10-CM | POA: Diagnosis not present

## 2017-08-08 DIAGNOSIS — Z9189 Other specified personal risk factors, not elsewhere classified: Secondary | ICD-10-CM | POA: Diagnosis not present

## 2017-08-08 DIAGNOSIS — Z1389 Encounter for screening for other disorder: Secondary | ICD-10-CM | POA: Diagnosis not present

## 2017-08-08 DIAGNOSIS — I739 Peripheral vascular disease, unspecified: Secondary | ICD-10-CM | POA: Diagnosis not present

## 2017-08-08 DIAGNOSIS — I10 Essential (primary) hypertension: Secondary | ICD-10-CM | POA: Diagnosis not present

## 2017-08-08 DIAGNOSIS — E038 Other specified hypothyroidism: Secondary | ICD-10-CM | POA: Diagnosis not present

## 2017-08-08 DIAGNOSIS — J9611 Chronic respiratory failure with hypoxia: Secondary | ICD-10-CM | POA: Diagnosis not present

## 2017-08-08 DIAGNOSIS — Z1212 Encounter for screening for malignant neoplasm of rectum: Secondary | ICD-10-CM | POA: Diagnosis not present

## 2017-08-08 DIAGNOSIS — Z6830 Body mass index (BMI) 30.0-30.9, adult: Secondary | ICD-10-CM | POA: Diagnosis not present

## 2017-08-08 DIAGNOSIS — J449 Chronic obstructive pulmonary disease, unspecified: Secondary | ICD-10-CM | POA: Diagnosis not present

## 2017-08-08 DIAGNOSIS — G6289 Other specified polyneuropathies: Secondary | ICD-10-CM | POA: Diagnosis not present

## 2017-08-08 DIAGNOSIS — M545 Low back pain: Secondary | ICD-10-CM | POA: Diagnosis not present

## 2017-08-08 DIAGNOSIS — J301 Allergic rhinitis due to pollen: Secondary | ICD-10-CM | POA: Diagnosis not present

## 2017-09-14 DIAGNOSIS — E663 Overweight: Secondary | ICD-10-CM | POA: Diagnosis not present

## 2017-09-14 DIAGNOSIS — J9611 Chronic respiratory failure with hypoxia: Secondary | ICD-10-CM | POA: Diagnosis not present

## 2017-09-14 DIAGNOSIS — Z6829 Body mass index (BMI) 29.0-29.9, adult: Secondary | ICD-10-CM | POA: Diagnosis not present

## 2017-09-14 DIAGNOSIS — J440 Chronic obstructive pulmonary disease with acute lower respiratory infection: Secondary | ICD-10-CM | POA: Diagnosis not present

## 2017-09-23 DIAGNOSIS — I10 Essential (primary) hypertension: Secondary | ICD-10-CM | POA: Diagnosis not present

## 2017-09-23 DIAGNOSIS — E038 Other specified hypothyroidism: Secondary | ICD-10-CM | POA: Diagnosis not present

## 2017-09-23 DIAGNOSIS — E782 Mixed hyperlipidemia: Secondary | ICD-10-CM | POA: Diagnosis not present

## 2017-09-23 DIAGNOSIS — G6289 Other specified polyneuropathies: Secondary | ICD-10-CM | POA: Diagnosis not present

## 2017-09-23 DIAGNOSIS — E663 Overweight: Secondary | ICD-10-CM | POA: Diagnosis not present

## 2017-09-23 DIAGNOSIS — C61 Malignant neoplasm of prostate: Secondary | ICD-10-CM | POA: Diagnosis not present

## 2017-09-23 DIAGNOSIS — Z6829 Body mass index (BMI) 29.0-29.9, adult: Secondary | ICD-10-CM | POA: Diagnosis not present

## 2017-09-23 DIAGNOSIS — Z1389 Encounter for screening for other disorder: Secondary | ICD-10-CM | POA: Diagnosis not present

## 2017-11-21 DIAGNOSIS — Z9189 Other specified personal risk factors, not elsewhere classified: Secondary | ICD-10-CM | POA: Diagnosis not present

## 2017-11-21 DIAGNOSIS — E782 Mixed hyperlipidemia: Secondary | ICD-10-CM | POA: Diagnosis not present

## 2017-11-21 DIAGNOSIS — N400 Enlarged prostate without lower urinary tract symptoms: Secondary | ICD-10-CM | POA: Diagnosis not present

## 2017-11-21 DIAGNOSIS — Z79891 Long term (current) use of opiate analgesic: Secondary | ICD-10-CM | POA: Diagnosis not present

## 2017-11-21 DIAGNOSIS — F331 Major depressive disorder, recurrent, moderate: Secondary | ICD-10-CM | POA: Diagnosis not present

## 2017-11-21 DIAGNOSIS — E038 Other specified hypothyroidism: Secondary | ICD-10-CM | POA: Diagnosis not present

## 2017-11-21 DIAGNOSIS — I10 Essential (primary) hypertension: Secondary | ICD-10-CM | POA: Diagnosis not present

## 2017-11-21 DIAGNOSIS — J449 Chronic obstructive pulmonary disease, unspecified: Secondary | ICD-10-CM | POA: Diagnosis not present

## 2017-11-24 DIAGNOSIS — I10 Essential (primary) hypertension: Secondary | ICD-10-CM | POA: Diagnosis not present

## 2017-11-24 DIAGNOSIS — Z23 Encounter for immunization: Secondary | ICD-10-CM | POA: Diagnosis not present

## 2017-11-24 DIAGNOSIS — Z6829 Body mass index (BMI) 29.0-29.9, adult: Secondary | ICD-10-CM | POA: Diagnosis not present

## 2017-11-24 DIAGNOSIS — F331 Major depressive disorder, recurrent, moderate: Secondary | ICD-10-CM | POA: Diagnosis not present

## 2017-11-24 DIAGNOSIS — Z0001 Encounter for general adult medical examination with abnormal findings: Secondary | ICD-10-CM | POA: Diagnosis not present

## 2017-11-28 ENCOUNTER — Ambulatory Visit (INDEPENDENT_AMBULATORY_CARE_PROVIDER_SITE_OTHER)
Admission: RE | Admit: 2017-11-28 | Discharge: 2017-11-28 | Disposition: A | Discharge status: Home / Self Care | Payer: Medicare Other | Source: Ambulatory Visit | Attending: Pulmonary Disease | Admitting: Pulmonary Disease

## 2017-11-28 ENCOUNTER — Ambulatory Visit (INDEPENDENT_AMBULATORY_CARE_PROVIDER_SITE_OTHER): Payer: Medicare Other | Admitting: Pulmonary Disease

## 2017-11-28 ENCOUNTER — Encounter: Payer: Self-pay | Admitting: Pulmonary Disease

## 2017-11-28 VITALS — BP 114/66 | HR 102 | Ht 71.0 in | Wt 211.6 lb

## 2017-11-28 DIAGNOSIS — J449 Chronic obstructive pulmonary disease, unspecified: Secondary | ICD-10-CM

## 2017-11-28 DIAGNOSIS — R0989 Other specified symptoms and signs involving the circulatory and respiratory systems: Secondary | ICD-10-CM | POA: Diagnosis not present

## 2017-11-28 MED ORDER — ROFLUMILAST 500 MCG PO TABS: 500.0000 ug | ORAL_TABLET | Freq: Every day | ORAL | 11 refills | Status: DC

## 2017-11-28 MED ORDER — FLUTICASONE-UMECLIDIN-VILANT 100-62.5-25 MCG/INH IN AEPB: 1.0000 | INHALATION_SPRAY | Freq: Every day | RESPIRATORY_TRACT | 5 refills | Status: DC

## 2017-11-28 NOTE — Patient Instructions (Signed)
Will change inhalers to Trelegy We will check your oxygen levels on exertion today We will try to get insurance approval for [place] Chest x-ray today Make sure you get a pneumonia vaccine at her primary care  Follow-up in 6 months.

## 2017-11-28 NOTE — Progress Notes (Signed)
Christipher Rieger    [id number]    [birth year]  Primary Care Physician:Daniel, Mitzie Na, MD  Referring Physician: Caryl Bis, MD [place], [place] [address]  Chief complaint:  Follow up for COPD GOLD D  HPI: [name] is 72 year old with past medical history of COPD GOLD D (CAT score 26, multiple exacebations), peripheral artery disease, depression, panic attack, hyperlipidemia, hypertension, GERD.   Admitted to [facility] in [place] from 1/29 to 03/06/16 with acute hypercapnic and hypoxic respiratory failure, bronchitis, COPD exacerbation.  Maintained on Breo, Incruse.  Trelegy is not covered by insurance Is also on chronic prednisone at 10 mg.  He has about 20-pack-year smoking history. He quit in 1990. There is no family history of lung problems.   Interim History: Continues on Breo and incruse.  He has good days and bad days with regard to his breathing Prednisone taper to 5 mg but he had to go back up to 10 due to increasing congestion and dyspnea.  Outpatient Encounter Medications as of 11/28/2017  Medication Sig  . ALPRAZolam (XANAX) 0.5 MG tablet Take 0.5 mg by mouth at bedtime as needed for anxiety.  [name] amLODipine (NORVASC) 10 MG tablet TK 1 T PO QD  . DULoxetine (CYMBALTA) 20 MG capsule Take 20 mg by mouth daily.  . fluticasone furoate-vilanterol (BREO ELLIPTA) 200-25 MCG/INH AEPB Inhale 1 puff into the lungs daily.  [name] ipratropium-albuterol (DUONEB) 0.5-2.5 (3) MG/3ML SOLN INHALE THE CONTENTS OF 1 VIAL VIA NEBULIZER FOUR TIMES A DAY  . montelukast (SINGULAIR) 10 MG tablet TK 1 T PO QD  . Oxycodone HCl 20 MG TABS   . predniSONE (DELTASONE) 10 MG tablet Take 5 mg by mouth daily with breakfast.   . simvastatin (ZOCOR) 40 MG tablet TK 1 T PO  QD FOR HIGH CHOLESTEROL  . umeclidinium bromide (INCRUSE ELLIPTA) 62.5 MCG/INH AEPB Inhale 1 puff into the lungs daily.  Enid Cutter HFA 108 (90 Base) MCG/ACT inhaler    No facility-administered  encounter medications on file as of 11/28/2017.    Physical Exam: Blood pressure 114/66, pulse (!) 102, height 5\' 11"  (1.803 m), weight 211 lb 9.6 oz (96 kg), SpO2 97 %. Gen:      No acute distress HEENT:  EOMI, sclera anicteric Neck:     No masses; no thyromegaly Lungs:    Clear to auscultation bilaterally; normal respiratory effort CV:         Regular rate and rhythm; no murmurs Abd:      + bowel sounds; soft, non-tender; no palpable masses, no distension Ext:    No edema; adequate peripheral perfusion Skin:      Warm and dry; no rash Neuro: alert and oriented x 3 Psych: normal mood and affect  Data Reviewed: Imaging  Chest x-ray 12/22/07- no active disease  Chest x-ray 03/01/2017- chronic COPD changes. No acute process I have reviewed the images personally.  PFTs 07/09/16 FVC 2.64 [67%), FEV1 1.39 [46%), F/F 53, TLC 99%, DLCO 33% Severe obstruction, severe diffusion defect. [employer]  Labs A1AT 03/11/16- 130, PIMM phenotype. CBC 12/25/2017-WBC 4.9, eos 10%, absolute eosinophil count 490  Assessment:  COPD GOLD D Continues on Breo and incruse.  Will put in a prescription for Trelegy inhaler to see if it is covered by insurance Continues on chronic prednisone which we are unable to taper. We will start him on Daliresp to see if he can come off prednisone altogether Discussed  pulmonary rehab.  He will benefit from going to the program but he is concerned about the cost. We will readdress this at next visit. Chest x-ray today  Health maintenance 11/21/2017-influenza He plans on getting pneumonia vaccine at his primary care next week.  Plan/Recommendations: - Start trelegy instead of Breo and incruse - Continue prednisone. Start [place] O2 with ambulation and at night - CXR  Marshell Garfinkel MD [facility] 11/28/2017, 9:21 AM  CC: Caryl Bis, MD

## 2017-11-29 DIAGNOSIS — R972 Elevated prostate specific antigen [PSA]: Secondary | ICD-10-CM | POA: Diagnosis not present

## 2017-11-29 DIAGNOSIS — Z6829 Body mass index (BMI) 29.0-29.9, adult: Secondary | ICD-10-CM | POA: Diagnosis not present

## 2017-11-30 ENCOUNTER — Telehealth: Payer: Self-pay | Admitting: Pulmonary Disease

## 2017-11-30 NOTE — Telephone Encounter (Signed)
Called patient unable to reach left message to give us a call back.

## 2017-12-01 NOTE — Telephone Encounter (Signed)
  Advised pt of results. Pt understood and nothing further is needed.    Notes recorded by Marshell Garfinkel, MD on 11/29/2017 at 3:43 PM EDT Let patient know chest x-ray shows findings of COPD. There is no pneumonia or any other acute process.

## 2017-12-16 ENCOUNTER — Other Ambulatory Visit: Payer: Self-pay

## 2017-12-19 ENCOUNTER — Telehealth: Payer: Self-pay | Admitting: Pulmonary Disease

## 2017-12-19 MED ORDER — FLUTICASONE-UMECLIDIN-VILANT 100-62.5-25 MCG/INH IN AEPB: 1.0000 | INHALATION_SPRAY | Freq: Every day | RESPIRATORY_TRACT | 11 refills | Status: DC

## 2017-12-19 MED ORDER — FLUTICASONE-UMECLIDIN-VILANT 100-62.5-25 MCG/INH IN AEPB: 1.0000 | INHALATION_SPRAY | Freq: Every day | RESPIRATORY_TRACT | 5 refills | Status: DC

## 2017-12-19 NOTE — Telephone Encounter (Signed)
Rx for Trelegy printed and returned to triage.

## 2017-12-19 NOTE — Telephone Encounter (Signed)
Called and spoke with pt's wife Antony Odea who stated she called [place] patient assistance. Pt is in the process of trying to enroll for pt assistance and Jerline was told to have our office fax the Rx of Trelegy to [facility].  [facility] is going to send the application to pt's address for them but they were just needing [place] to send the Rx to [place].  Stated to pt we would take care of faxing an Rx to [place] patient assistance for pt. The fax number that this needs to be faxed to is [phone].  I have tried to print the Rx so I can fax it but triage is unable to print Rx's at this time.  Margie, can you please print the Rx for pt's Trelegy and have it signed by Dr. Vaughan Browner so we can fax it for pt. Thanks!

## 2017-12-19 NOTE — Telephone Encounter (Signed)
Fax has been faxed to [facility] for pt. Will send the Rx to scan for pt. Nothing further needed.

## 2017-12-20 DIAGNOSIS — N400 Enlarged prostate without lower urinary tract symptoms: Secondary | ICD-10-CM | POA: Diagnosis not present

## 2018-01-11 DIAGNOSIS — R0981 Nasal congestion: Secondary | ICD-10-CM | POA: Diagnosis not present

## 2018-01-11 DIAGNOSIS — J440 Chronic obstructive pulmonary disease with acute lower respiratory infection: Secondary | ICD-10-CM | POA: Diagnosis not present

## 2018-01-11 DIAGNOSIS — Z6828 Body mass index (BMI) 28.0-28.9, adult: Secondary | ICD-10-CM | POA: Diagnosis not present

## 2018-02-14 DIAGNOSIS — J449 Chronic obstructive pulmonary disease, unspecified: Secondary | ICD-10-CM | POA: Diagnosis not present

## 2018-02-14 DIAGNOSIS — N400 Enlarged prostate without lower urinary tract symptoms: Secondary | ICD-10-CM | POA: Diagnosis not present

## 2018-02-14 DIAGNOSIS — Z6829 Body mass index (BMI) 29.0-29.9, adult: Secondary | ICD-10-CM | POA: Diagnosis not present

## 2018-02-14 DIAGNOSIS — M25512 Pain in left shoulder: Secondary | ICD-10-CM | POA: Diagnosis not present

## 2018-02-16 DIAGNOSIS — E782 Mixed hyperlipidemia: Secondary | ICD-10-CM | POA: Diagnosis not present

## 2018-02-16 DIAGNOSIS — Z79891 Long term (current) use of opiate analgesic: Secondary | ICD-10-CM | POA: Diagnosis not present

## 2018-02-16 DIAGNOSIS — J440 Chronic obstructive pulmonary disease with acute lower respiratory infection: Secondary | ICD-10-CM | POA: Diagnosis not present

## 2018-02-16 DIAGNOSIS — R739 Hyperglycemia, unspecified: Secondary | ICD-10-CM | POA: Diagnosis not present

## 2018-02-16 DIAGNOSIS — I10 Essential (primary) hypertension: Secondary | ICD-10-CM | POA: Diagnosis not present

## 2018-02-16 DIAGNOSIS — K219 Gastro-esophageal reflux disease without esophagitis: Secondary | ICD-10-CM | POA: Diagnosis not present

## 2018-02-22 DIAGNOSIS — G252 Other specified forms of tremor: Secondary | ICD-10-CM | POA: Diagnosis not present

## 2018-02-22 DIAGNOSIS — F331 Major depressive disorder, recurrent, moderate: Secondary | ICD-10-CM | POA: Diagnosis not present

## 2018-02-22 DIAGNOSIS — C61 Malignant neoplasm of prostate: Secondary | ICD-10-CM | POA: Diagnosis not present

## 2018-02-22 DIAGNOSIS — J9611 Chronic respiratory failure with hypoxia: Secondary | ICD-10-CM | POA: Diagnosis not present

## 2018-02-22 DIAGNOSIS — Z79891 Long term (current) use of opiate analgesic: Secondary | ICD-10-CM | POA: Diagnosis not present

## 2018-02-22 DIAGNOSIS — I739 Peripheral vascular disease, unspecified: Secondary | ICD-10-CM | POA: Diagnosis not present

## 2018-02-22 DIAGNOSIS — J449 Chronic obstructive pulmonary disease, unspecified: Secondary | ICD-10-CM | POA: Diagnosis not present

## 2018-02-22 DIAGNOSIS — I10 Essential (primary) hypertension: Secondary | ICD-10-CM | POA: Diagnosis not present

## 2018-05-01 DIAGNOSIS — E782 Mixed hyperlipidemia: Secondary | ICD-10-CM | POA: Diagnosis not present

## 2018-05-01 DIAGNOSIS — F331 Major depressive disorder, recurrent, moderate: Secondary | ICD-10-CM | POA: Diagnosis not present

## 2018-05-01 DIAGNOSIS — J449 Chronic obstructive pulmonary disease, unspecified: Secondary | ICD-10-CM | POA: Diagnosis not present

## 2018-05-31 DIAGNOSIS — I739 Peripheral vascular disease, unspecified: Secondary | ICD-10-CM | POA: Diagnosis not present

## 2018-05-31 DIAGNOSIS — I10 Essential (primary) hypertension: Secondary | ICD-10-CM | POA: Diagnosis not present

## 2018-05-31 DIAGNOSIS — F331 Major depressive disorder, recurrent, moderate: Secondary | ICD-10-CM | POA: Diagnosis not present

## 2018-05-31 DIAGNOSIS — G252 Other specified forms of tremor: Secondary | ICD-10-CM | POA: Diagnosis not present

## 2018-05-31 DIAGNOSIS — C61 Malignant neoplasm of prostate: Secondary | ICD-10-CM | POA: Diagnosis not present

## 2018-05-31 DIAGNOSIS — J9611 Chronic respiratory failure with hypoxia: Secondary | ICD-10-CM | POA: Diagnosis not present

## 2018-05-31 DIAGNOSIS — J449 Chronic obstructive pulmonary disease, unspecified: Secondary | ICD-10-CM | POA: Diagnosis not present

## 2018-05-31 DIAGNOSIS — Z79891 Long term (current) use of opiate analgesic: Secondary | ICD-10-CM | POA: Diagnosis not present

## 2018-06-01 DIAGNOSIS — E782 Mixed hyperlipidemia: Secondary | ICD-10-CM | POA: Diagnosis not present

## 2018-06-01 DIAGNOSIS — E039 Hypothyroidism, unspecified: Secondary | ICD-10-CM | POA: Diagnosis not present

## 2018-06-09 DIAGNOSIS — Z6829 Body mass index (BMI) 29.0-29.9, adult: Secondary | ICD-10-CM | POA: Diagnosis not present

## 2018-06-09 DIAGNOSIS — J440 Chronic obstructive pulmonary disease with acute lower respiratory infection: Secondary | ICD-10-CM | POA: Diagnosis not present

## 2018-06-29 ENCOUNTER — Ambulatory Visit: Payer: Medicare Other | Admitting: Acute Care

## 2018-07-03 ENCOUNTER — Other Ambulatory Visit: Payer: Self-pay

## 2018-07-03 ENCOUNTER — Ambulatory Visit (INDEPENDENT_AMBULATORY_CARE_PROVIDER_SITE_OTHER): Payer: Medicare Other | Admitting: Acute Care

## 2018-07-03 ENCOUNTER — Encounter: Payer: Self-pay | Admitting: Acute Care

## 2018-07-03 DIAGNOSIS — R0902 Hypoxemia: Secondary | ICD-10-CM

## 2018-07-03 DIAGNOSIS — J449 Chronic obstructive pulmonary disease, unspecified: Secondary | ICD-10-CM

## 2018-07-03 DIAGNOSIS — G4734 Idiopathic sleep related nonobstructive alveolar hypoventilation: Secondary | ICD-10-CM

## 2018-07-03 MED ORDER — ROFLUMILAST 500 MCG PO TABS: 500.0000 ug | ORAL_TABLET | Freq: Every day | ORAL | 0 refills | Status: DC

## 2018-07-03 NOTE — Assessment & Plan Note (Signed)
Continue night time oxygen at 3 L [place] Saturation goals are 88-92% Please contact office for sooner follow up if symptoms do not improve or worsen or seek emergency care

## 2018-07-03 NOTE — Progress Notes (Signed)
Virtual Visit via Telephone Note  I connected with George Andersen on 07/03/18 at 11:30 AM EDT by telephone and verified that I am speaking with the correct person using two identifiers.  Location: Patient: At home Provider: At the office   I discussed the limitations, risks, security and privacy concerns of performing an evaluation and management service by telephone and the availability of in person appointments. I also discussed with the patient that there may be a patient responsible charge related to this service. The patient expressed understanding and agreed to proceed.  I  confirmed date of birth and address to authenticate patient  Identity. My nurse Quentin Ore reviewed medications and ordered any refills required.   Synopsis George Andersen is 73 year old with past medical history of COPD GOLD D (CAT score 26, multiple exacebations), peripheral artery disease, depression, panic attack, hyperlipidemia, hypertension, GERD.  Maintenance medication  Trelegy Prednisone 10 mg daily Duoneb four times daily Ventolin inhaler Singulair Daliresp>> cannot afford  Flare history:   History of Present Illness: Pt. Presents for a telephone visit for 6 month follow up.  [name] He was last seen in the office 11/29/2018 by Dr. Vaughan Browner. He states he had a recent COPD flare about a month ago. He was seen by his PCP and treated with Levaquin 750 mg x 7 days. He states this did clear up his lungs but he feels that he is not breathing right. He states it feels like he " isn't breathing out all the way". He states this is usually after he has been outside in the humidity. He states it feels like he is hyperventilating. He states he does wear oxygen. He uses 3 L [place] as needed. He states he usually wears it with exertion only and at bedtime. He is compliant with his Trelegy every day.He is using his Duonebs about 3-4 times daily.He states his lungs are clear as a whistle. No wheezing.He states his oxygen  saturations are 90-92% without the oxygen. He is compliant with his Singulair. He denies any allergies. He has had minimal secretions. He states what he coughs up is clear. He states that when he is on Daliresp he does much better. He is unable to afford the $$300+ dollars it costs per month. He denies any fever, chest pain, orthopnea of hemoptysis.He states he does not want to leave home for any physician visits as he is so worries about COVID.    Observations/Objective: Chest x-ray 12/22/07- no active disease  Chest x-ray 03/01/2017- chronic COPD changes. No acute process I have reviewed the images personally.  A1AT 03/11/16- 130, PIMM phenotype.  PFTs 07/09/16 FVC 2.64 [67%), FEV1 1.39 [46%), F/F 53, TLC 99%, DLCO 33% Severe obstruction, severe diffusion defect. [employer]  Assessment and Plan: COPD GOLD D Continue your Trelegy 1 puff once daily. Rinse mouth after use. Continue your Duonebs 3-4 times daily Continue Singulair 10 mg daily Continue prednisone as you have been doing. I will see what we can do about getting you Daliresp at a discounted price We will check for some samples of Daliresp, if we have some we will call you to come and pick it up.  Follow up in 3 months with Dr.Mannam or Rhylin Venters NP Wear your oxygen with exertion and at bedtime at 3 L [place] Saturation goals are 88-92% Please contact office for sooner follow up if symptoms do not improve or worsen or seek emergency care   Addendum We were able to find samples of Daliresp for [name]. They  are the 250 mcg strength.  He has been instructed to take 250 mcg x 2 weeks and then increase to 500 mcg if well tolerated.     Follow Up Instructions:    I discussed the assessment and treatment plan with the patient. The patient was provided an opportunity to ask questions and all were answered. The patient agreed with the plan and demonstrated an understanding of the instructions.   The patient was advised to call back or  seek an in-person evaluation if the symptoms worsen or if the condition fails to improve as anticipated.  I provided 22 minutes of non-face-to-face time during this encounter.   Magdalen Spatz, NP 07/03/2018 2:53 PM

## 2018-07-03 NOTE — Assessment & Plan Note (Signed)
Wear oxygen with exertion Saturation goals are 88-92% Please contact office for sooner follow up if symptoms do not improve or worsen or seek emergency care

## 2018-07-03 NOTE — Assessment & Plan Note (Signed)
Stable interval States he has had some issues when outside in the heat and humidity Plan: Continue your Trelegy 1 puff once daily. Rinse mouth after use. Continue your Duonebs 3-4 times daily Continue Singulair 10 mg daily Continue prednisone as you have been doing. I will see what we can do about getting you Daliresp at a discounted price We will check for some samples of Daliresp, if we have some we will call you to come and pick it up.  Follow up in 3 months with Dr.Mannam  Wear your oxygen with exertion and at bedtime Saturation goals are 88-92% Please contact office for sooner follow up if symptoms do not improve or worsen or seek emergency care

## 2018-07-03 NOTE — Patient Instructions (Addendum)
It is good to talk to you today. Continue your Trelegy 1 puff once daily. Rinse mouth after use. Continue your Duonebs 3-4 times daily Continue Singulair 10 mg daily Continue prednisone as you have been doing. I will see what we can do about getting you Daliresp at a discounted price We will check for some samples of Daliresp, if we have some we will call you to come and pick it up.  Follow up in 3 months with Dr.Mannam  Wear your oxygen with exertion and at bedtime Saturation goals are 88-92% Please contact office for sooner follow up if symptoms do not improve or worsen or seek emergency care

## 2018-07-04 DIAGNOSIS — J441 Chronic obstructive pulmonary disease with (acute) exacerbation: Secondary | ICD-10-CM | POA: Diagnosis not present

## 2018-07-28 ENCOUNTER — Telehealth: Payer: Self-pay | Admitting: Pulmonary Disease

## 2018-07-28 MED ORDER — DALIRESP 250 MCG PO TABS: 2.0000 | ORAL_TABLET | Freq: Every day | ORAL | 0 refills | Status: AC

## 2018-07-28 NOTE — Telephone Encounter (Signed)
Samples have been left at the front for pick up. ATC pt, no answer and there was no option to leave a message.

## 2018-07-31 NOTE — Telephone Encounter (Signed)
Attempted to call pt but unable to reach and unable to leave VM. Will try to call back later. 

## 2018-08-01 DIAGNOSIS — I10 Essential (primary) hypertension: Secondary | ICD-10-CM | POA: Diagnosis not present

## 2018-08-01 DIAGNOSIS — J449 Chronic obstructive pulmonary disease, unspecified: Secondary | ICD-10-CM | POA: Diagnosis not present

## 2018-08-01 DIAGNOSIS — E782 Mixed hyperlipidemia: Secondary | ICD-10-CM | POA: Diagnosis not present

## 2018-08-01 NOTE — Telephone Encounter (Signed)
Contacted patient by phone regarding samples at front door. Patient acknowledged understanding and plans to pick up this week.  Nothing further needed.

## 2018-08-17 DIAGNOSIS — E782 Mixed hyperlipidemia: Secondary | ICD-10-CM | POA: Diagnosis not present

## 2018-08-17 DIAGNOSIS — E038 Other specified hypothyroidism: Secondary | ICD-10-CM | POA: Diagnosis not present

## 2018-08-17 DIAGNOSIS — J440 Chronic obstructive pulmonary disease with acute lower respiratory infection: Secondary | ICD-10-CM | POA: Diagnosis not present

## 2018-08-17 DIAGNOSIS — I10 Essential (primary) hypertension: Secondary | ICD-10-CM | POA: Diagnosis not present

## 2018-08-17 DIAGNOSIS — R739 Hyperglycemia, unspecified: Secondary | ICD-10-CM | POA: Diagnosis not present

## 2018-08-17 DIAGNOSIS — K219 Gastro-esophageal reflux disease without esophagitis: Secondary | ICD-10-CM | POA: Diagnosis not present

## 2018-08-17 DIAGNOSIS — Z79891 Long term (current) use of opiate analgesic: Secondary | ICD-10-CM | POA: Diagnosis not present

## 2018-08-22 DIAGNOSIS — J9611 Chronic respiratory failure with hypoxia: Secondary | ICD-10-CM | POA: Diagnosis not present

## 2018-08-22 DIAGNOSIS — Z79891 Long term (current) use of opiate analgesic: Secondary | ICD-10-CM | POA: Diagnosis not present

## 2018-08-22 DIAGNOSIS — J449 Chronic obstructive pulmonary disease, unspecified: Secondary | ICD-10-CM | POA: Diagnosis not present

## 2018-08-22 DIAGNOSIS — E663 Overweight: Secondary | ICD-10-CM | POA: Diagnosis not present

## 2018-08-22 DIAGNOSIS — C61 Malignant neoplasm of prostate: Secondary | ICD-10-CM | POA: Diagnosis not present

## 2018-08-22 DIAGNOSIS — I739 Peripheral vascular disease, unspecified: Secondary | ICD-10-CM | POA: Diagnosis not present

## 2018-08-22 DIAGNOSIS — I10 Essential (primary) hypertension: Secondary | ICD-10-CM | POA: Diagnosis not present

## 2018-08-22 DIAGNOSIS — E782 Mixed hyperlipidemia: Secondary | ICD-10-CM | POA: Diagnosis not present

## 2018-08-30 ENCOUNTER — Telehealth: Payer: Self-pay | Admitting: Pulmonary Disease

## 2018-08-30 DIAGNOSIS — J449 Chronic obstructive pulmonary disease, unspecified: Secondary | ICD-10-CM

## 2018-08-30 MED ORDER — ROFLUMILAST 500 MCG PO TABS: 500.0000 ug | ORAL_TABLET | Freq: Every day | ORAL | 0 refills | Status: DC

## 2018-08-30 NOTE — Telephone Encounter (Signed)
Called the patient back to make him aware we do not have any samples of Daliresp in the office at this time. I asked the patient if a prescription can be sent to the pharmacy and which dosage was it that he needed. Patient confirmed 500 mg. I apologized that we do not have any samples available, and not sure when we will get any in.  Patient voiced understanding and agreed for a prescription to be sent to the pharmacy. I suggested that he may want to ask the pharmacy if there is a cash price he can pay for a some of the prescription to be issued, and hopefully some samples will come in soon.   Pharmacy confirmed. Prescription sent. Nothing further needed at this time.

## 2018-09-04 ENCOUNTER — Telehealth: Payer: Self-pay | Admitting: Pulmonary Disease

## 2018-09-04 DIAGNOSIS — J449 Chronic obstructive pulmonary disease, unspecified: Secondary | ICD-10-CM

## 2018-09-04 MED ORDER — ROFLUMILAST 500 MCG PO TABS: 500.0000 ug | ORAL_TABLET | Freq: Every day | ORAL | 3 refills | Status: AC

## 2018-09-04 NOTE — Telephone Encounter (Signed)
Spoke with pt's wife and advised her that the rx daliresp was sent to pharmacy. Nothing further is needed.

## 2018-09-11 ENCOUNTER — Telehealth: Payer: Self-pay | Admitting: Pulmonary Disease

## 2018-09-11 NOTE — Telephone Encounter (Signed)
George Andersen, please advise if you have received a CMN from [facility] in regards to pt's neb solutions?

## 2018-09-12 NOTE — Telephone Encounter (Signed)
I just checked Dr. Matilde Bash box and there is nothing in there from [facility].  Also checked Anita's box and there was nothing in there either.  Erica, please advise if you have picked anything up from them today or Rodena Piety, please also advise if you have picked anything up from them? As of 2:55 there was nothing in either box.

## 2018-09-12 NOTE — Telephone Encounter (Signed)
As of this moment I don't have anything for this patient. It could have come in since I left [place] office yesterday I will check and see when I get back to [facility] this afternoon. It could be in Dr. Matilde Bash mail box up front

## 2018-09-12 NOTE — Telephone Encounter (Signed)
George Andersen had a copy and I got a copy so Dr. Vaughan Browner has at least 1 copy of the form to sign before he leaves today

## 2018-09-12 NOTE — Telephone Encounter (Signed)
[facility] was going to refax orders for pt's meds for nebulizer machine.

## 2018-09-13 ENCOUNTER — Telehealth: Payer: Self-pay | Admitting: Pulmonary Disease

## 2018-09-13 NOTE — Telephone Encounter (Signed)
Dr. Vaughan Browner has signed forms and they are ready.

## 2018-09-13 NOTE — Telephone Encounter (Signed)
Called and spoke with Patient's Wife, George Andersen.  George Andersen stated Patient was prescribed Daliresp and it is to expensive.   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  Message routed to Dr Vaughan Browner

## 2018-09-15 NOTE — Telephone Encounter (Signed)
Spoke with George Andersen and advised her that we were waiting on a response from Dr. Vaughan Browner and that we would call her as soon as possible.

## 2018-09-15 NOTE — Telephone Encounter (Signed)
Pt is following up about Daliresp.  Needing something similar that is not as expensive.

## 2018-09-18 NOTE — Telephone Encounter (Signed)
There are no good alternatives to daliresp. Please make follow up in clinic to discuss

## 2018-09-18 NOTE — Telephone Encounter (Signed)
Dr. Vaughan Browner, please advise on this for pt. Thanks!

## 2018-09-18 NOTE — Telephone Encounter (Signed)
Called and spoke with pt letting him know that Dr. Vaughan Browner said that there is no good alternatives to daliresp and he said to schedule f/u. Stated to pt that this could be a televisit and pt verbalized understanding. Pt has been scheduled televisit with Dr.Mannam Friday 8/21 at 3:45. Nothing further needed.

## 2018-09-22 ENCOUNTER — Telehealth: Payer: Self-pay | Admitting: Pulmonary Disease

## 2018-09-22 ENCOUNTER — Other Ambulatory Visit: Payer: Self-pay

## 2018-09-22 ENCOUNTER — Telehealth: Payer: Self-pay | Admitting: Pharmacist

## 2018-09-22 ENCOUNTER — Encounter: Payer: Self-pay | Admitting: Pulmonary Disease

## 2018-09-22 ENCOUNTER — Ambulatory Visit (INDEPENDENT_AMBULATORY_CARE_PROVIDER_SITE_OTHER): Payer: Medicare Other | Admitting: Pulmonary Disease

## 2018-09-22 DIAGNOSIS — J449 Chronic obstructive pulmonary disease, unspecified: Secondary | ICD-10-CM | POA: Diagnosis not present

## 2018-09-22 NOTE — Patient Instructions (Signed)
Continue your current inhalers, oxygen and prednisone We will start paperwork to get patient assistance with Daliresp.

## 2018-09-22 NOTE — Telephone Encounter (Signed)
See other message

## 2018-09-22 NOTE — Telephone Encounter (Signed)
Patient had telephone OV with Dr Vaughan Browner today. Patient was doing well on Daliresp, but was unable to afford it.  Patient assistance with [employer], signed by Dr Vaughan Browner, mailed to Patient to complete income and sign.  Instructed patient to drop it off at office or mail it back to office. Will follow up with Patient later next week.

## 2018-09-22 NOTE — Telephone Encounter (Signed)
Dr. Vaughan Browner is interested in switching patient to [facility] but cost has been issue.    There is no prescription insurance on file.  He would not be co-pay card eligible.   Patient may be eligible for [employer] patient assistance program.  He would need to fill out the application and bring a pharmacy print out of out-of-pocket expenses showing he spent 3% of income on prescription medications.  Application printed and placed in Pod A.   Please let us know if we can be of further assistance.   Mariella Saa, PharmD, [place], [facility] Pharmacist [phone]  09/22/2018 4:36 PM

## 2018-09-22 NOTE — Progress Notes (Signed)
Virtual Visit via Telephone Note  I connected with George Andersen on 09/22/18 at  3:45 PM EDT by telephone and verified that I am speaking with the correct person using two identifiers.  Location: Patient: Home Provider: [facility], [place], [place]   I discussed the limitations, risks, security and privacy concerns of performing an evaluation and management service by telephone and the availability of in person appointments. I also discussed with the patient that there may be a patient responsible charge related to this service. The patient expressed understanding and agreed to proceed.    Observations/Objective: History of Present Illness: Follow up for COPD GOLD D   George Andersen is 73 year old with past medical history of COPD GOLD D (CAT score 26, multiple exacebations), peripheral artery disease, depression, panic attack, hyperlipidemia, hypertension, GERD. Continues on Trelegy inhaler. He was on Daliresp with improvement in symptoms but had to stop due to cost.  States that his symptoms are worsened since he got off [place].  Assessment and Plan: COPD Gold D with frequent exacerbations On chronic prednisone, Trelegy, supplemental oxygen He has responded to [place] but needs patient assistance to continue therapy  Follow Up Instructions: Continue current therapy Patient assistance for Daliresp   I discussed the assessment and treatment plan with the patient. The patient was provided an opportunity to ask questions and all were answered. The patient agreed with the plan and demonstrated an understanding of the instructions.   The patient was advised to call back or seek an in-person evaluation if the symptoms worsen or if the condition fails to improve as anticipated.  I provided 25 minutes of non-face-to-face time during this encounter.   Marshell Garfinkel MD  Pulmonary and Critical Care 09/22/2018, 4:21 PM

## 2018-10-02 DIAGNOSIS — J449 Chronic obstructive pulmonary disease, unspecified: Secondary | ICD-10-CM | POA: Diagnosis not present

## 2018-10-02 DIAGNOSIS — I10 Essential (primary) hypertension: Secondary | ICD-10-CM | POA: Diagnosis not present

## 2018-10-02 DIAGNOSIS — E782 Mixed hyperlipidemia: Secondary | ICD-10-CM | POA: Diagnosis not present

## 2018-10-10 NOTE — Telephone Encounter (Signed)
Message routed to [place] to follow up.

## 2018-10-12 NOTE — Telephone Encounter (Signed)
I spoke with the patient and his wife. She states that she spoke with [employer] and they explained to her that they would have to include her out of pocket expense for her medications as well since they are married. She reports that the medications that she is on does not cost her that much out of pocket. She was wondering if there was a different medication that the patient could try since this program will not work for them. Please advise.

## 2018-10-13 NOTE — Telephone Encounter (Signed)
Called [employer] Me, rep Otila Kluver advised patient's application was denied due to insurance discrepancy. Advised rep that patient had Medicare D and not [employer]. Rep advised that we need to send in new prescription for redetermination. Asked if they needed any other documents, like the previously requested prescription OOP; rep advised no that they had everything they needed on file. Printed rx form and will fax after obtaining MD signature. Will follow up next week.  11:20 AM Beatriz Chancellor, CPhT

## 2018-10-16 NOTE — Telephone Encounter (Signed)
Message routed to [place] as Juluis Rainier

## 2018-10-17 ENCOUNTER — Telehealth: Payer: Self-pay | Admitting: Pulmonary Disease

## 2018-10-17 MED ORDER — TRELEGY ELLIPTA 100-62.5-25 MCG/INH IN AEPB: 1.0000 | INHALATION_SPRAY | Freq: Every day | RESPIRATORY_TRACT | 11 refills | Status: AC

## 2018-10-17 NOTE — Telephone Encounter (Signed)
Spoke with pt's spouse Antony Odea, states that they have filled out a [place] application for Trelegy patient assistance.  [facility] is requesting [place] to fax a rx to them of the Trelegy: Rx needs to include pt's [place] ID #: [id number]  Fax #: [id number]  This rx has been printed and given to [facility], since she is working with Dr. Vaughan Browner tomorrow (9/16) and will have him sign this.  Routing message to [place] to follow up on.   Pt and spouse are also asking if there is a cheaper alternative to Daliresp that can be considered for pt.  Pt does not qualify for pt assistance for this med, and cannot afford the copay for this med.  (please see previous phone notes where our office has tried to attain this for him).  Dr. Vaughan Browner please advise if there's an alternative to [facility] for pt.  Thanks!

## 2018-10-18 MED ORDER — THEOPHYLLINE ER 100 MG PO TB12: 100.0000 mg | ORAL_TABLET | Freq: Two times a day (BID) | ORAL | 3 refills | Status: DC

## 2018-10-18 NOTE — Telephone Encounter (Signed)
Called [employer], they received new prescription and updated patient's insurance information. Since patient has Medicare part D, patient must submit documents that the household has spent 3% out of pocket on prescriptions. Documents already submitted only reflect less than 1% of income has been spent on prescriptions- appears patient on submitted prescription documents for himself, but he can submit for his household.  If they have not spent 3% on prescriptions, patient can appeal and submit documentation proving the household has spent 10% of their income on any medical-related expense.  Per Epic notes, it appears the RN is awaiting MD recommendation to decide next steps/ alternate options.   11:09 AM Beatriz Chancellor, CPhT

## 2018-10-18 NOTE — Telephone Encounter (Signed)
Dr. Vaughan Browner in office today will wait for his recommendations

## 2018-10-18 NOTE — Telephone Encounter (Signed)
I was given a Trelegy script by Caryl Pina for Dr. Vaughan Browner to sign and to be faxed to [place]. I have faxed this Rx with his [place] ID noted on fax.

## 2018-10-18 NOTE — Telephone Encounter (Signed)
Called spoke with patient let him know Dr. Vaughan Browner sent in Rx to his pharm. Patient voiced understanding. A follow appt made for 10/23 patient wants telephone visit due to his fear of catching covid while out and about.   televisit 1415 on 11/24/18 scheduled for patient

## 2018-10-18 NOTE — Telephone Encounter (Signed)
There is no good alternative to daliresp but we can try an older medication called theophylline.  I have send in a prescription for it to pharmacy.  Make follow up appointment to see me in 1-2 months

## 2018-10-20 ENCOUNTER — Telehealth: Payer: Self-pay | Admitting: Pulmonary Disease

## 2018-10-20 ENCOUNTER — Telehealth: Payer: Self-pay | Admitting: Pharmacist

## 2018-10-20 NOTE — Addendum Note (Signed)
Addended byMarshell Garfinkel on: 10/20/2018 04:15 PM   Modules accepted: Orders

## 2018-10-20 NOTE — Telephone Encounter (Signed)
Message routed to app of the day, Rexene Edison, NP  Tammy, this patient was seen by Dr. Vaughan Browner 09/22/18 due to COPD. The patient did not qualify for assistance for Daliresp. And the cost for it was too high out of pocket for the patient. He was issued Theophylline on 10/17/18 as an alternative (based on the patient assistance message on that same date) per Dr. Vaughan Browner.  Based on the message from the patient's wife today, I called the [facility] in [place] at [phone]. I confirmed the Theophylline (Theodur) 100 mg 12 hr tablets are no longer made. But Theophylline 200 mg ER is the only strength available and in stock.   Would this be an appropriate substitute? If so, please advise so prescription can be sent in. Thank you.

## 2018-10-20 NOTE — Telephone Encounter (Signed)
Ok. Please change prescription to theophylline 300 mg XL

## 2018-10-20 NOTE — Telephone Encounter (Signed)
PM please advise, theophylline 300mg  once daily?  Thanks

## 2018-10-20 NOTE — Telephone Encounter (Signed)
That should be fine but will need to be under Dr. Vaughan Browner. So may need to wait to make sure he is okay with this.  Will route to him for approval

## 2018-10-20 NOTE — Telephone Encounter (Signed)
Received rejection from [facility] regarding theophylline prescription. Theophylline 100 mg and 200 mg dosage strengths are unavailable from drug manufacturer at this time. Called [facility] and spoke with pharmacy staff member on 10/20/18 at 3:17 PM who stated theophylline 300 mg XL tablets are available on-hand within pharmacy and the pharmacy is able to order more for the future.

## 2018-10-20 NOTE — Telephone Encounter (Signed)
Message kept in triage box until response received from Dr. Vaughan Browner.

## 2018-10-23 NOTE — Telephone Encounter (Signed)
Dr. Vaughan Browner please advise if you are ok with patient being prescribed Theophylline 200mg  tabs as an alternative to [employer] d/t cost.  Thanks!

## 2018-10-23 NOTE — Telephone Encounter (Signed)
I am getting different messages about the availability of the doses. Please check with out clinic pharmacy team and order the available dose either 200 or 300 mg/ day is ok.

## 2018-10-23 NOTE — Telephone Encounter (Signed)
Waiting on response from Dr. Vaughan Browner regarding the information received from pharmacy team.

## 2018-10-23 NOTE — Telephone Encounter (Signed)
Please see the message below from Dr. Vaughan Browner regarding the available dosage of Theophylline. Thank you much.

## 2018-10-26 MED ORDER — THEOPHYLLINE ER 300 MG PO CP24: 300.0000 mg | ORAL_CAPSULE | Freq: Every day | ORAL | 3 refills | Status: DC

## 2018-10-26 NOTE — Telephone Encounter (Signed)
Dr. Vaughan Browner any update or recommendations?  Will route to [facility] and Dr. Vaughan Browner

## 2018-10-26 NOTE — Telephone Encounter (Signed)
As per our pharmacy Theophylline ER 300 mg is available at [place] Please order once daily. thanks

## 2018-10-26 NOTE — Telephone Encounter (Signed)
Called [facility] in [place] to verify that Theophylline ER 300mg  is in stock.  Spoke with pharmacist Newell Coral and gave verbal Rx for the Theophylline ER 300mg  once daily.  The Theophylline 100mg  has been permanently deleted from their system.  Called spoke with patient, informed him of the above.  Patient voiced his understanding and denied any further questions/concerns.  Med list has been updated. Nothing further needed; will sign off.

## 2018-10-27 NOTE — Telephone Encounter (Signed)
Prescribe 200 mg ER daily

## 2018-10-27 NOTE — Telephone Encounter (Signed)
[facility] and spoke with pharmacist Vee.  Patient has already picked up the Theophylline 300mg  ER on yesterday with a $72 copay.  Vee is unsure if the 200mg  ER is in stock or of the new copay.  Dr Vaughan Browner, would you like patient to take the Theophylline 300mg  ER that he picked up yesterday?  Pharmacist Vee is unsure if the 300mg  ER is scored and can be broken.  Please advise, thank you.

## 2018-10-27 NOTE — Telephone Encounter (Signed)
Yes. If theophylline 300 ER is available then he should continue on that

## 2018-10-27 NOTE — Telephone Encounter (Signed)
Called spoke with pharmacy to let them know MD was okay with patient continuing on the 300ER .  Called spoke with patient. Let him know. Patient voiced understanding, nothing further needed at this time.

## 2018-10-30 ENCOUNTER — Telehealth: Payer: Self-pay | Admitting: Pulmonary Disease

## 2018-10-30 NOTE — Telephone Encounter (Signed)
Left message for George Andersen to call back.

## 2018-10-31 NOTE — Telephone Encounter (Signed)
The script for Trelegy was faxed to the number that was given to me by Caryl Pina for [place]. The script has been scanned into the chart. I am not sure why they have not received it. I will look into it further to see what I can figure out.

## 2018-10-31 NOTE — Telephone Encounter (Signed)
Called and spoke with George Andersen.  George Andersen stated they spoke with [place] and [place] never received Trelegy prescription.  Prescription was printed and given to [facility] to follow up per 10/17/18 telephone note.  Will route to [place] to follow up

## 2018-11-01 DIAGNOSIS — E785 Hyperlipidemia, unspecified: Secondary | ICD-10-CM | POA: Diagnosis not present

## 2018-11-01 DIAGNOSIS — I10 Essential (primary) hypertension: Secondary | ICD-10-CM | POA: Diagnosis not present

## 2018-11-02 NOTE — Telephone Encounter (Signed)
Erica any update? Did we refax this back to [facility]?

## 2018-11-02 NOTE — Telephone Encounter (Signed)
Yes I have faxed it again. Not sure what happened the first time. I had this problem with another patient as well and I reached out to them for an update. I will call this afternoon to follow up to make sure they have received it.

## 2018-11-03 NOTE — Telephone Encounter (Signed)
Prescription sent to pharmacy 10/26/18. Nothing further needed.

## 2018-11-06 NOTE — Telephone Encounter (Signed)
I have not. I tried calling to see if they received it and was on hold to long and had to hang up.

## 2018-11-06 NOTE — Telephone Encounter (Signed)
George Andersen, did you ever receive any confirmation that this was received? Thanks!

## 2018-11-07 NOTE — Telephone Encounter (Signed)
Called [place] patient assistance at [phone]. Spoke with George Andersen. States that they did receive the pt application and prescription. He has been approved through 02/01/2019. Pt's medication is being shipped out to him today.  Spoke with pt. He is aware of this information. Nothing further was needed at this time.

## 2018-11-09 DIAGNOSIS — Z23 Encounter for immunization: Secondary | ICD-10-CM | POA: Diagnosis not present

## 2018-11-24 ENCOUNTER — Ambulatory Visit (INDEPENDENT_AMBULATORY_CARE_PROVIDER_SITE_OTHER): Payer: Medicare Other | Admitting: Pulmonary Disease

## 2018-11-24 DIAGNOSIS — J449 Chronic obstructive pulmonary disease, unspecified: Secondary | ICD-10-CM | POA: Diagnosis not present

## 2018-11-24 NOTE — Progress Notes (Signed)
Virtual Visit via Telephone Note  I connected with George Andersen on 11/27/18 at  2:15 PM EDT by telephone and verified that I am speaking with the correct person using two identifiers.  Location: Patient: Home Provider: [facility], [place], [place]   I discussed the limitations, risks, security and privacy concerns of performing an evaluation and management service by telephone and the availability of in person appointments. I also discussed with the patient that there may be a patient responsible charge related to this service. The patient expressed understanding and agreed to proceed.    Observations/Objective: History of Present Illness: Follow up for COPD GOLD D   George Andersen is 73 year old with past medical history of COPD GOLD D (CAT score 26, multiple exacebations), peripheral artery disease, depression, panic attack, hyperlipidemia, hypertension, GERD. Continues on Trelegy inhaler. He was on Daliresp with improvement in symptoms but had to stop due to cost.  States that his symptoms are worsened since he got off [place].  Assessment and Plan: COPD Gold D with frequent exacerbations On chronic prednisone, Trelegy, supplemental oxygen Has some response to Daliresp but cannot afford it.  We cannot obtain patient assistance to get him back on it We had started him on theophylline 300 mg XR per day. Patient is tolerating it well.  Has not noticed any difference in his breathing for now.  Follow Up Instructions: Continue current therapy including Trelegy, theophylline Check theophylline levels in 2 weeks Follow-up in clinic in 2 to 4 weeks   I discussed the assessment and treatment plan with the patient. The patient was provided an opportunity to ask questions and all were answered. The patient agreed with the plan and demonstrated an understanding of the instructions.   The patient was advised to call back or seek an in-person evaluation if the symptoms  worsen or if the condition fails to improve as anticipated.  I provided 25 minutes of non-face-to-face time during this encounter.   Marshell Garfinkel MD [facility] 11/27/2018, 5:29 PM

## 2018-11-27 ENCOUNTER — Encounter: Payer: Self-pay | Admitting: Pulmonary Disease

## 2018-11-27 NOTE — Patient Instructions (Signed)
Continue the Trelegy inhaler Continue the theophylline at current dose We will check theophylline levels in 2 weeks in the lab Follow-up in 2 to 4 weeks.

## 2018-11-28 ENCOUNTER — Telehealth: Payer: Self-pay | Admitting: Pulmonary Disease

## 2018-11-28 ENCOUNTER — Telehealth: Payer: Self-pay

## 2018-11-28 DIAGNOSIS — J449 Chronic obstructive pulmonary disease, unspecified: Secondary | ICD-10-CM

## 2018-11-28 NOTE — Telephone Encounter (Signed)
-----   Message from Marshell Garfinkel, MD sent at 11/27/2018  5:46 PM EDT ----- Hi, I completed the AVS. Please order theophylline levels in 2 weeks and then follow up in clinic. thanks

## 2018-11-28 NOTE — Telephone Encounter (Signed)
I called and spoke with the patient and made him aware that he needs to have his labs drawn in 2 weeks and a follow up. He states that he is nervous about coming out and doing the lab work. I advised him that for his follow up we can do a telephone or video visit but he would have to come to have his labs drawn. He states that he will call back to let us know if he will. I have placed the lab order.

## 2018-11-28 NOTE — Telephone Encounter (Signed)
Spoke with patient. He wanted to know if he could have his labs done at his PCP's office, Dr. Gar Ponto. He is concerned about coming into the office due to [place]. At his PCP's office, they are drawing labs outside. Advised him that I would check with Dr. Vaughan Browner first, he verbalized understanding.   Dr. Vaughan Browner, please advise. Thanks!

## 2018-11-29 NOTE — Telephone Encounter (Signed)
Called and spoke to patient. Let patient know that he can get labs drawn at PCP. Patient stated he had also called PCP office to verify. Patient verbalized understanding. Nothing further needed at this time.

## 2018-11-29 NOTE — Telephone Encounter (Signed)
Yes. The labs can be done at PCP's office.

## 2018-11-30 DIAGNOSIS — J449 Chronic obstructive pulmonary disease, unspecified: Secondary | ICD-10-CM | POA: Diagnosis not present

## 2018-11-30 DIAGNOSIS — Z0001 Encounter for general adult medical examination with abnormal findings: Secondary | ICD-10-CM | POA: Diagnosis not present

## 2018-11-30 DIAGNOSIS — F331 Major depressive disorder, recurrent, moderate: Secondary | ICD-10-CM | POA: Diagnosis not present

## 2018-11-30 DIAGNOSIS — C61 Malignant neoplasm of prostate: Secondary | ICD-10-CM | POA: Diagnosis not present

## 2018-11-30 DIAGNOSIS — Z79891 Long term (current) use of opiate analgesic: Secondary | ICD-10-CM | POA: Diagnosis not present

## 2018-11-30 DIAGNOSIS — J9611 Chronic respiratory failure with hypoxia: Secondary | ICD-10-CM | POA: Diagnosis not present

## 2018-11-30 DIAGNOSIS — G252 Other specified forms of tremor: Secondary | ICD-10-CM | POA: Diagnosis not present

## 2018-11-30 DIAGNOSIS — I10 Essential (primary) hypertension: Secondary | ICD-10-CM | POA: Diagnosis not present

## 2018-12-01 DIAGNOSIS — I10 Essential (primary) hypertension: Secondary | ICD-10-CM | POA: Diagnosis not present

## 2018-12-01 DIAGNOSIS — E782 Mixed hyperlipidemia: Secondary | ICD-10-CM | POA: Diagnosis not present

## 2018-12-21 IMAGING — DX DG CHEST 2V
2 series · 2 of 2 positions shown · non-contrast
Comparison: 01/28/2015. More recent prior studies are not
available.

CLINICAL DATA: Chronic shortness of breath, worsening over the last
6 months. Ex-smoker with history emphysema, pneumonia and
hypertension.

EXAM:
CHEST  2 VIEW

[chest pa]
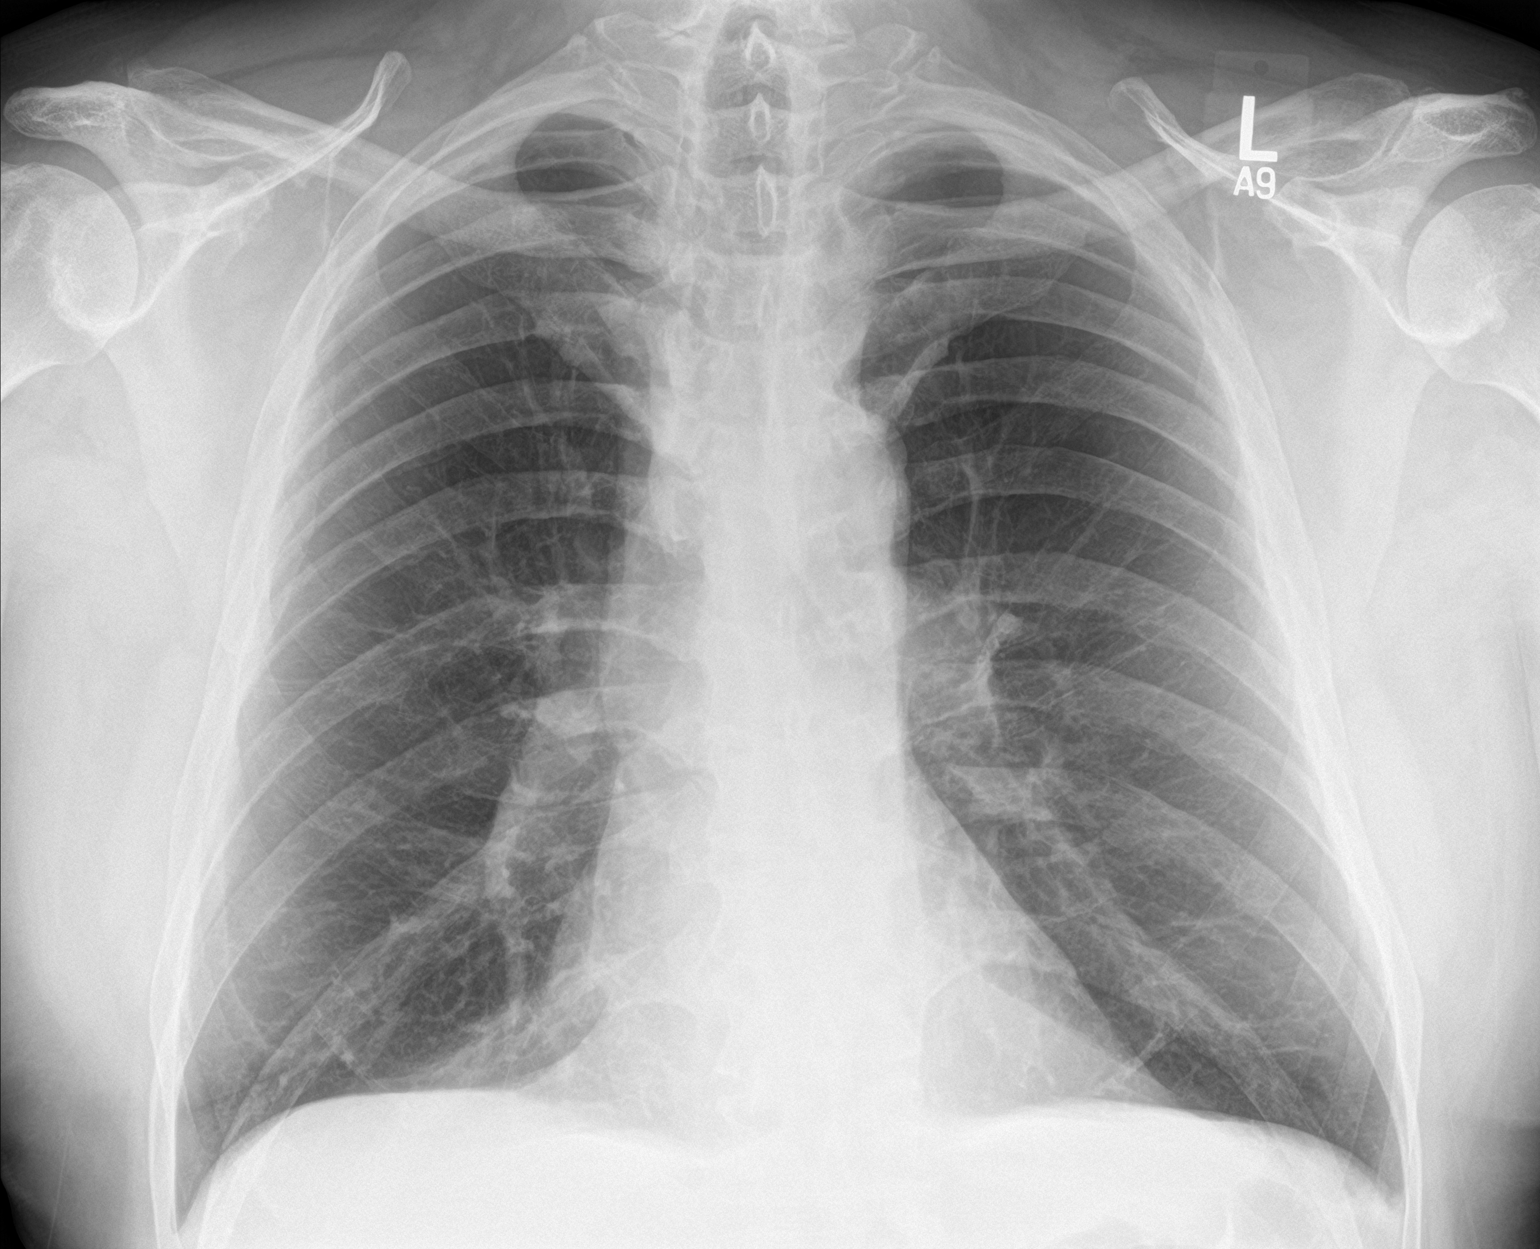

[chest lat]
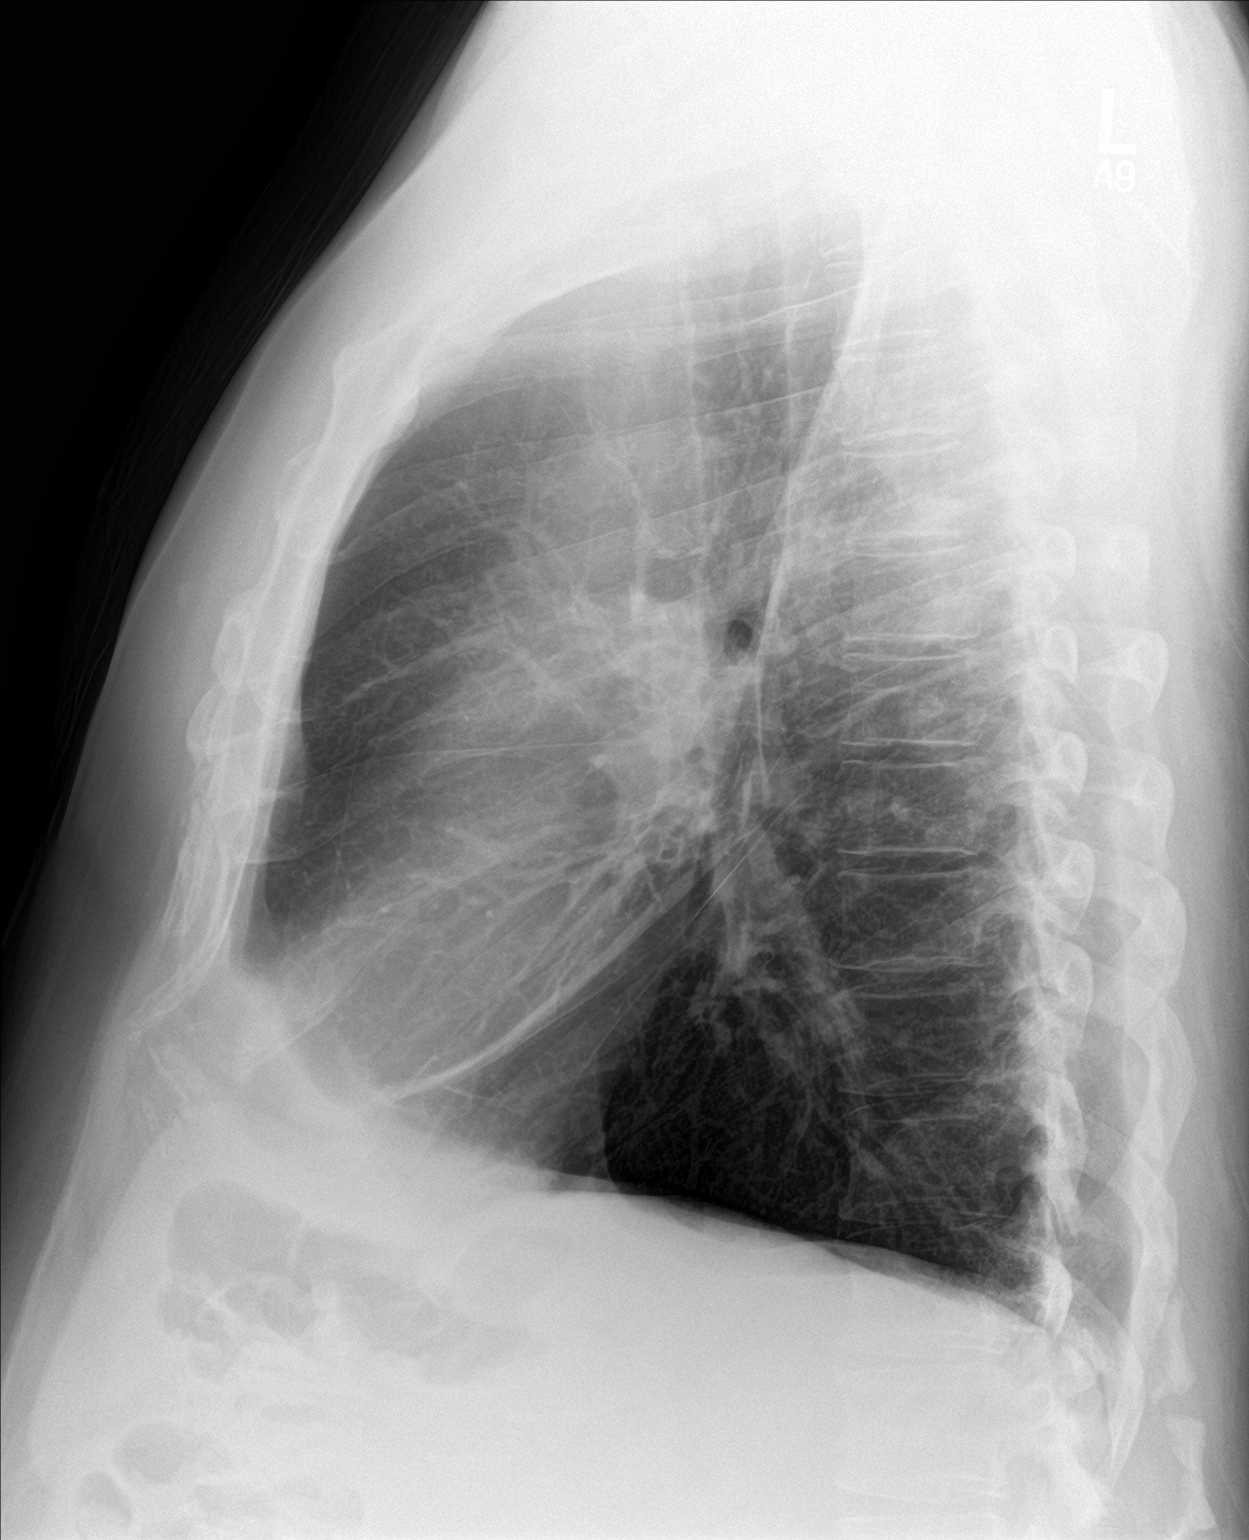

[2 of 2 positions shown; findings below may reference images not displayed]

FINDINGS: The heart size and mediastinal contours are stable. The lungs are
mildly hyperinflated with mild chronic central airway thickening. No
edema, confluent airspace opacity, pleural effusion or pneumothorax.
The bones appear unchanged.
IMPRESSION: Stable radiographic appearance of the chest with evidence of mild
chronic obstructive pulmonary disease. No acute cardiopulmonary
process.

## 2019-01-23 ENCOUNTER — Other Ambulatory Visit: Payer: Self-pay

## 2019-01-23 MED ORDER — THEOPHYLLINE ER 300 MG PO CP24: 300.0000 mg | ORAL_CAPSULE | Freq: Every day | ORAL | 3 refills | Status: DC

## 2019-02-01 DIAGNOSIS — J9611 Chronic respiratory failure with hypoxia: Secondary | ICD-10-CM | POA: Diagnosis not present

## 2019-02-01 DIAGNOSIS — J449 Chronic obstructive pulmonary disease, unspecified: Secondary | ICD-10-CM | POA: Diagnosis not present

## 2019-02-19 DIAGNOSIS — J441 Chronic obstructive pulmonary disease with (acute) exacerbation: Secondary | ICD-10-CM | POA: Diagnosis not present

## 2019-02-19 DIAGNOSIS — I10 Essential (primary) hypertension: Secondary | ICD-10-CM | POA: Diagnosis not present

## 2019-02-28 DIAGNOSIS — E038 Other specified hypothyroidism: Secondary | ICD-10-CM | POA: Diagnosis not present

## 2019-02-28 DIAGNOSIS — E782 Mixed hyperlipidemia: Secondary | ICD-10-CM | POA: Diagnosis not present

## 2019-02-28 DIAGNOSIS — Z79891 Long term (current) use of opiate analgesic: Secondary | ICD-10-CM | POA: Diagnosis not present

## 2019-02-28 DIAGNOSIS — I10 Essential (primary) hypertension: Secondary | ICD-10-CM | POA: Diagnosis not present

## 2019-02-28 DIAGNOSIS — R739 Hyperglycemia, unspecified: Secondary | ICD-10-CM | POA: Diagnosis not present

## 2019-02-28 DIAGNOSIS — K219 Gastro-esophageal reflux disease without esophagitis: Secondary | ICD-10-CM | POA: Diagnosis not present

## 2019-03-02 DIAGNOSIS — I10 Essential (primary) hypertension: Secondary | ICD-10-CM | POA: Diagnosis not present

## 2019-03-02 DIAGNOSIS — E7849 Other hyperlipidemia: Secondary | ICD-10-CM | POA: Diagnosis not present

## 2019-03-05 ENCOUNTER — Telehealth: Payer: Self-pay | Admitting: Pulmonary Disease

## 2019-03-05 MED ORDER — THEOPHYLLINE ER 300 MG PO TB12: 300.0000 mg | ORAL_TABLET | Freq: Two times a day (BID) | ORAL | 3 refills | Status: DC

## 2019-03-05 NOTE — Telephone Encounter (Signed)
I called and spoke with the patient and apologized that the wrong dosage was sent to the pharmacy for a refill. I have sent the 12hr to his verified pharmacy on file. Nothing further is needed.

## 2019-03-07 DIAGNOSIS — R4582 Worries: Secondary | ICD-10-CM | POA: Diagnosis not present

## 2019-03-07 DIAGNOSIS — F331 Major depressive disorder, recurrent, moderate: Secondary | ICD-10-CM | POA: Diagnosis not present

## 2019-03-07 DIAGNOSIS — I10 Essential (primary) hypertension: Secondary | ICD-10-CM | POA: Diagnosis not present

## 2019-03-07 DIAGNOSIS — E782 Mixed hyperlipidemia: Secondary | ICD-10-CM | POA: Diagnosis not present

## 2019-03-07 DIAGNOSIS — I739 Peripheral vascular disease, unspecified: Secondary | ICD-10-CM | POA: Diagnosis not present

## 2019-03-07 DIAGNOSIS — J449 Chronic obstructive pulmonary disease, unspecified: Secondary | ICD-10-CM | POA: Diagnosis not present

## 2019-03-07 DIAGNOSIS — C61 Malignant neoplasm of prostate: Secondary | ICD-10-CM | POA: Diagnosis not present

## 2019-03-07 DIAGNOSIS — J9611 Chronic respiratory failure with hypoxia: Secondary | ICD-10-CM | POA: Diagnosis not present

## 2019-03-15 ENCOUNTER — Other Ambulatory Visit: Payer: Self-pay

## 2019-03-15 ENCOUNTER — Ambulatory Visit: Payer: Medicare Other | Attending: Internal Medicine

## 2019-03-15 DIAGNOSIS — Z23 Encounter for immunization: Secondary | ICD-10-CM | POA: Insufficient documentation

## 2019-03-15 NOTE — Progress Notes (Signed)
   Covid-19 Vaccination Clinic  Name:  Vallen Linthicum    MRN: [id number] DOB: [birth year]  03/15/2019  Mr. Jenerette was observed post Covid-19 immunization for 30 minutes based on pre-vaccination screening without incidence. He was provided with Vaccine Information Sheet and instruction to access the V-Safe system.   Mr. Mctier was instructed to call 911 with any severe reactions post vaccine: [name] Difficulty breathing  . Swelling of your face and throat  . A fast heartbeat  . A bad rash all over your body  . Dizziness and weakness    Immunizations Administered    Name Date Dose VIS Date Route   Moderna COVID-19 Vaccine 03/15/2019 10:54 AM 0.5 mL 01/02/2019 Intramuscular   Manufacturer: Moderna   Lot: [id number]   [id number]

## 2019-03-30 DIAGNOSIS — I10 Essential (primary) hypertension: Secondary | ICD-10-CM | POA: Diagnosis not present

## 2019-04-16 ENCOUNTER — Ambulatory Visit: Payer: Medicare Other | Attending: Internal Medicine

## 2019-04-16 DIAGNOSIS — Z23 Encounter for immunization: Secondary | ICD-10-CM

## 2019-04-16 NOTE — Progress Notes (Signed)
   Covid-19 Vaccination Clinic  Name:  George Andersen    MRN: [id number] DOB: [birth year]  04/16/2019  [name] was observed post Covid-19 immunization for 15 minutes without incident. He was provided with Vaccine Information Sheet and instruction to access the V-Safe system.   [name] was instructed to call 911 with any severe reactions post vaccine: [name] Difficulty breathing  . Swelling of face and throat  . A fast heartbeat  . A bad rash all over body  . Dizziness and weakness   Immunizations Administered    Name Date Dose VIS Date Route   Moderna COVID-19 Vaccine 04/16/2019 10:29 AM 0.5 mL 01/02/2019 Intramuscular   Manufacturer: Moderna   Lot: [id number]   [id number]

## 2019-04-23 DIAGNOSIS — J441 Chronic obstructive pulmonary disease with (acute) exacerbation: Secondary | ICD-10-CM | POA: Diagnosis not present

## 2019-05-02 DIAGNOSIS — I10 Essential (primary) hypertension: Secondary | ICD-10-CM | POA: Diagnosis not present

## 2019-05-02 DIAGNOSIS — E7849 Other hyperlipidemia: Secondary | ICD-10-CM | POA: Diagnosis not present

## 2019-05-29 DIAGNOSIS — C61 Malignant neoplasm of prostate: Secondary | ICD-10-CM | POA: Diagnosis not present

## 2019-05-29 DIAGNOSIS — R739 Hyperglycemia, unspecified: Secondary | ICD-10-CM | POA: Diagnosis not present

## 2019-05-29 DIAGNOSIS — E782 Mixed hyperlipidemia: Secondary | ICD-10-CM | POA: Diagnosis not present

## 2019-05-29 DIAGNOSIS — K219 Gastro-esophageal reflux disease without esophagitis: Secondary | ICD-10-CM | POA: Diagnosis not present

## 2019-05-29 DIAGNOSIS — I10 Essential (primary) hypertension: Secondary | ICD-10-CM | POA: Diagnosis not present

## 2019-05-29 DIAGNOSIS — E7849 Other hyperlipidemia: Secondary | ICD-10-CM | POA: Diagnosis not present

## 2019-05-31 DIAGNOSIS — I10 Essential (primary) hypertension: Secondary | ICD-10-CM | POA: Diagnosis not present

## 2019-05-31 DIAGNOSIS — K219 Gastro-esophageal reflux disease without esophagitis: Secondary | ICD-10-CM | POA: Diagnosis not present

## 2019-05-31 DIAGNOSIS — E7849 Other hyperlipidemia: Secondary | ICD-10-CM | POA: Diagnosis not present

## 2019-06-01 DIAGNOSIS — K219 Gastro-esophageal reflux disease without esophagitis: Secondary | ICD-10-CM | POA: Diagnosis not present

## 2019-06-01 DIAGNOSIS — E7849 Other hyperlipidemia: Secondary | ICD-10-CM | POA: Diagnosis not present

## 2019-06-01 DIAGNOSIS — I10 Essential (primary) hypertension: Secondary | ICD-10-CM | POA: Diagnosis not present

## 2019-06-05 DIAGNOSIS — C61 Malignant neoplasm of prostate: Secondary | ICD-10-CM | POA: Diagnosis not present

## 2019-06-05 DIAGNOSIS — F331 Major depressive disorder, recurrent, moderate: Secondary | ICD-10-CM | POA: Diagnosis not present

## 2019-06-05 DIAGNOSIS — J9611 Chronic respiratory failure with hypoxia: Secondary | ICD-10-CM | POA: Diagnosis not present

## 2019-06-05 DIAGNOSIS — J449 Chronic obstructive pulmonary disease, unspecified: Secondary | ICD-10-CM | POA: Diagnosis not present

## 2019-06-05 DIAGNOSIS — I739 Peripheral vascular disease, unspecified: Secondary | ICD-10-CM | POA: Diagnosis not present

## 2019-06-05 DIAGNOSIS — E782 Mixed hyperlipidemia: Secondary | ICD-10-CM | POA: Diagnosis not present

## 2019-06-05 DIAGNOSIS — R4582 Worries: Secondary | ICD-10-CM | POA: Diagnosis not present

## 2019-06-05 DIAGNOSIS — I10 Essential (primary) hypertension: Secondary | ICD-10-CM | POA: Diagnosis not present

## 2019-08-31 DIAGNOSIS — I10 Essential (primary) hypertension: Secondary | ICD-10-CM | POA: Diagnosis not present

## 2019-08-31 DIAGNOSIS — E7849 Other hyperlipidemia: Secondary | ICD-10-CM | POA: Diagnosis not present

## 2019-08-31 DIAGNOSIS — E039 Hypothyroidism, unspecified: Secondary | ICD-10-CM | POA: Diagnosis not present

## 2019-09-05 DIAGNOSIS — J449 Chronic obstructive pulmonary disease, unspecified: Secondary | ICD-10-CM | POA: Diagnosis not present

## 2019-09-05 DIAGNOSIS — J9611 Chronic respiratory failure with hypoxia: Secondary | ICD-10-CM | POA: Diagnosis not present

## 2019-09-05 DIAGNOSIS — F331 Major depressive disorder, recurrent, moderate: Secondary | ICD-10-CM | POA: Diagnosis not present

## 2019-09-05 DIAGNOSIS — E782 Mixed hyperlipidemia: Secondary | ICD-10-CM | POA: Diagnosis not present

## 2019-09-05 DIAGNOSIS — Z79891 Long term (current) use of opiate analgesic: Secondary | ICD-10-CM | POA: Diagnosis not present

## 2019-09-05 DIAGNOSIS — C61 Malignant neoplasm of prostate: Secondary | ICD-10-CM | POA: Diagnosis not present

## 2019-09-05 DIAGNOSIS — I739 Peripheral vascular disease, unspecified: Secondary | ICD-10-CM | POA: Diagnosis not present

## 2019-09-05 DIAGNOSIS — I10 Essential (primary) hypertension: Secondary | ICD-10-CM | POA: Diagnosis not present

## 2019-09-06 ENCOUNTER — Other Ambulatory Visit: Payer: Self-pay | Admitting: Pulmonary Disease

## 2019-09-19 IMAGING — DX DG CHEST 2V
2 series · 2 of 2 positions shown · non-contrast
Comparison: Chest x-ray of March 01, 2017

CLINICAL DATA: Two days of chest congestion. History of COPD,
former smoker.

EXAM:
CHEST - 2 VIEW

[chest pa]
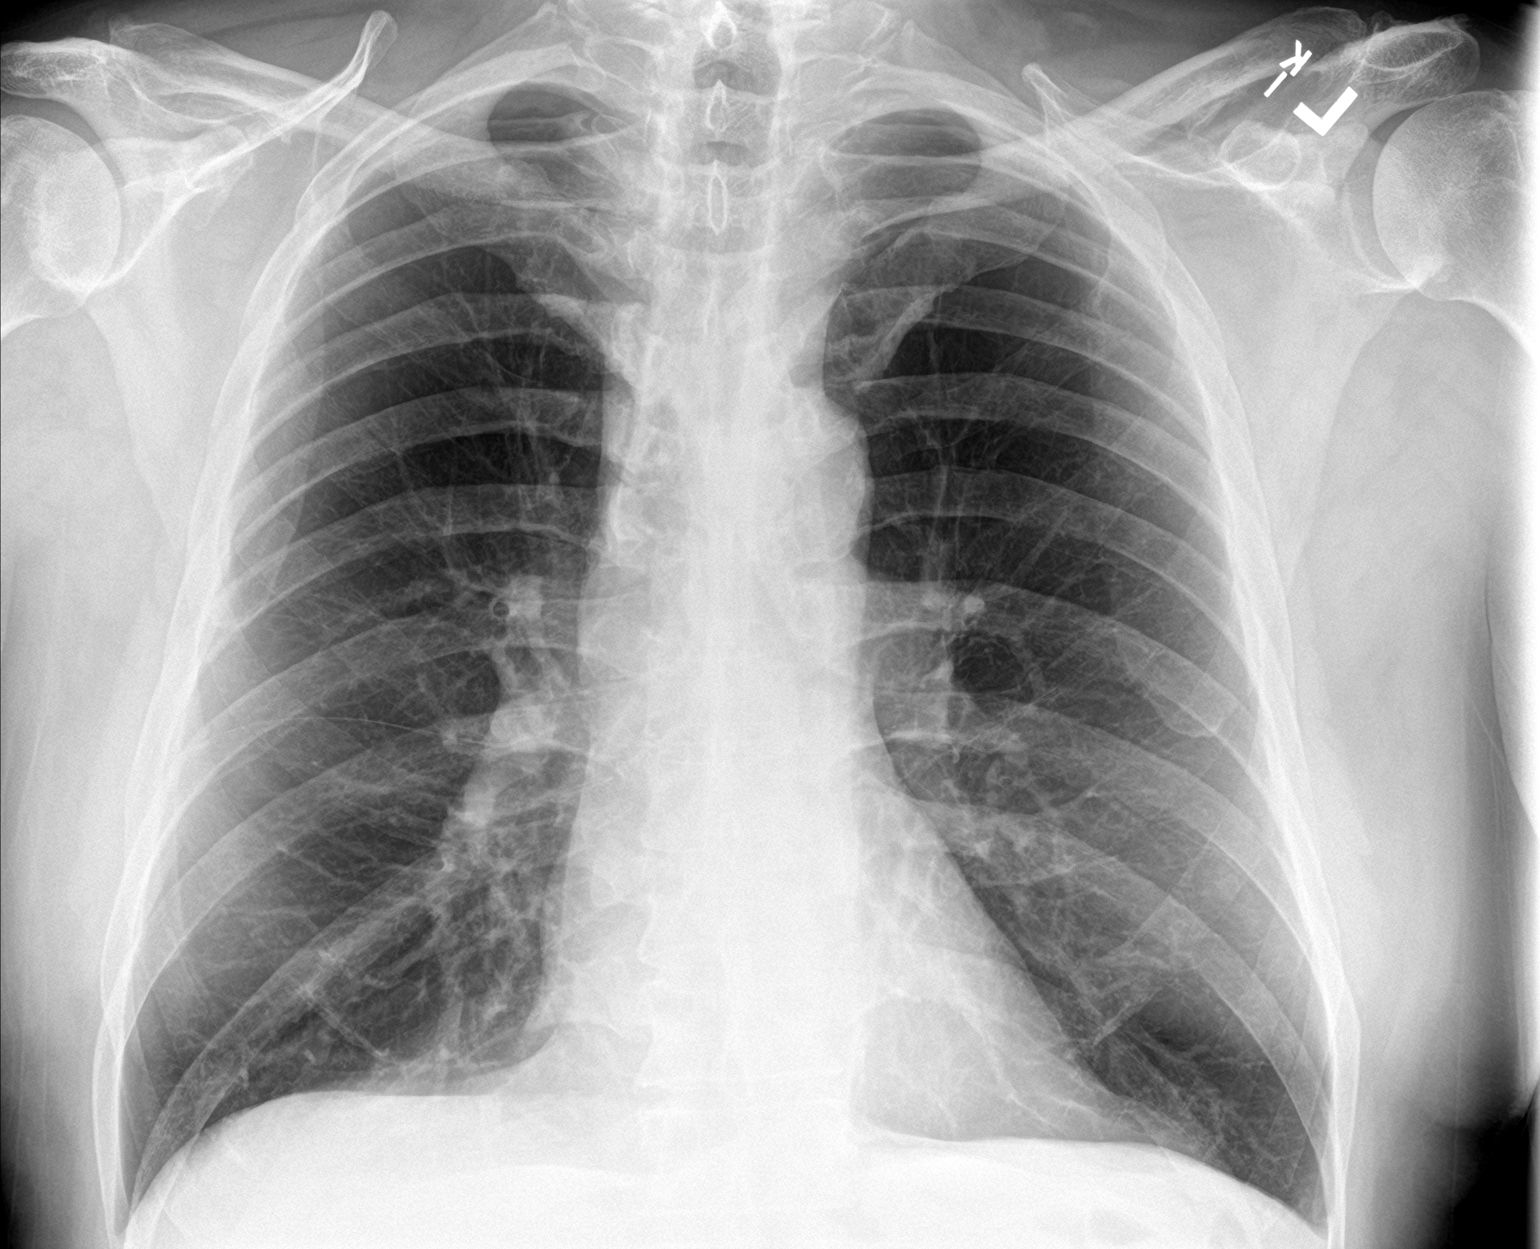

[chest lat]
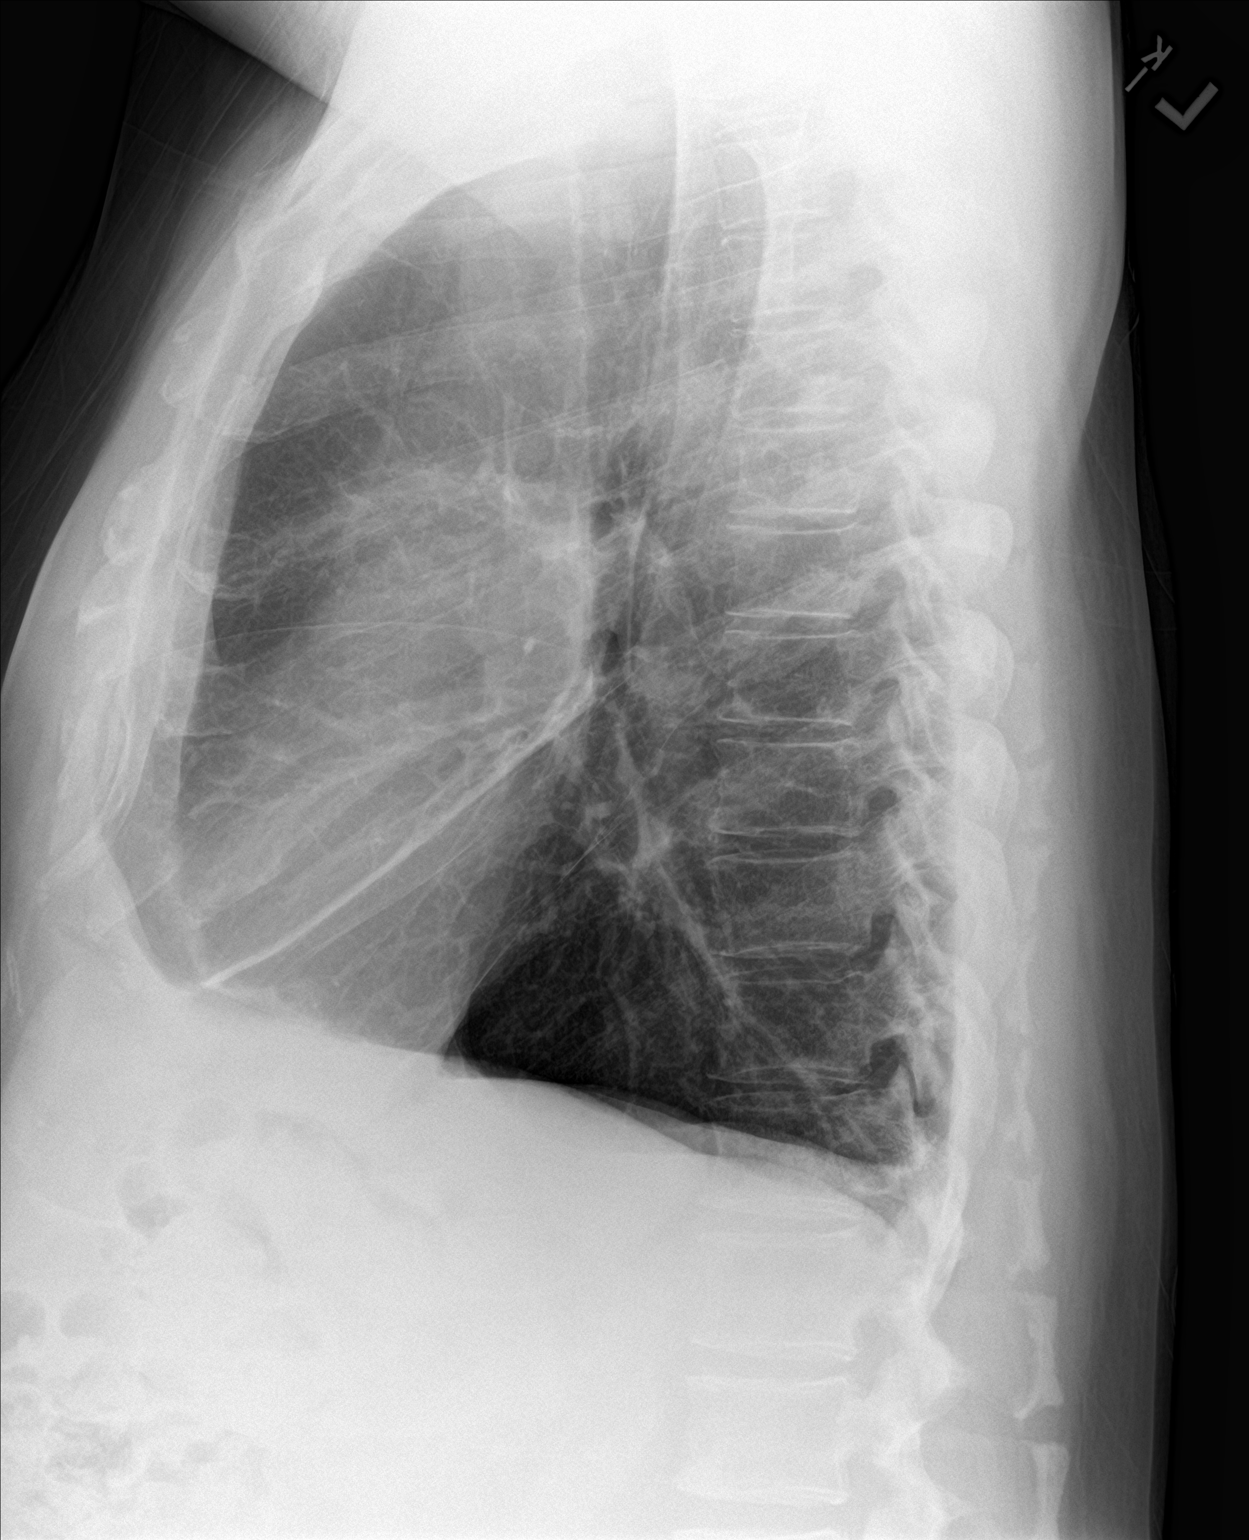

[2 of 2 positions shown; findings below may reference images not displayed]

FINDINGS: The lungs remain hyperinflated. There is mild hemidiaphragm
flattening. The interstitial markings are coarse in the infrahilar
regions bilaterally but this is stable. The heart and pulmonary
vascularity are normal. The mediastinum is normal in width. The
trachea is midline. The bony thorax exhibits no acute abnormality.
IMPRESSION: COPD. No pneumonia, CHF, nor other acute cardiopulmonary
abnormality.

## 2019-10-02 DIAGNOSIS — I10 Essential (primary) hypertension: Secondary | ICD-10-CM | POA: Diagnosis not present

## 2019-10-02 DIAGNOSIS — E7849 Other hyperlipidemia: Secondary | ICD-10-CM | POA: Diagnosis not present

## 2019-10-02 DIAGNOSIS — E039 Hypothyroidism, unspecified: Secondary | ICD-10-CM | POA: Diagnosis not present

## 2019-11-01 DIAGNOSIS — E7849 Other hyperlipidemia: Secondary | ICD-10-CM | POA: Diagnosis not present

## 2019-11-01 DIAGNOSIS — E039 Hypothyroidism, unspecified: Secondary | ICD-10-CM | POA: Diagnosis not present

## 2019-11-01 DIAGNOSIS — I10 Essential (primary) hypertension: Secondary | ICD-10-CM | POA: Diagnosis not present

## 2019-11-29 DIAGNOSIS — R739 Hyperglycemia, unspecified: Secondary | ICD-10-CM | POA: Diagnosis not present

## 2019-11-29 DIAGNOSIS — I10 Essential (primary) hypertension: Secondary | ICD-10-CM | POA: Diagnosis not present

## 2019-11-29 DIAGNOSIS — E038 Other specified hypothyroidism: Secondary | ICD-10-CM | POA: Diagnosis not present

## 2019-11-29 DIAGNOSIS — Z23 Encounter for immunization: Secondary | ICD-10-CM | POA: Diagnosis not present

## 2019-11-29 DIAGNOSIS — E782 Mixed hyperlipidemia: Secondary | ICD-10-CM | POA: Diagnosis not present

## 2019-12-04 DIAGNOSIS — C61 Malignant neoplasm of prostate: Secondary | ICD-10-CM | POA: Diagnosis not present

## 2019-12-04 DIAGNOSIS — Z79891 Long term (current) use of opiate analgesic: Secondary | ICD-10-CM | POA: Diagnosis not present

## 2019-12-04 DIAGNOSIS — E782 Mixed hyperlipidemia: Secondary | ICD-10-CM | POA: Diagnosis not present

## 2019-12-04 DIAGNOSIS — E038 Other specified hypothyroidism: Secondary | ICD-10-CM | POA: Diagnosis not present

## 2019-12-04 DIAGNOSIS — Z0001 Encounter for general adult medical examination with abnormal findings: Secondary | ICD-10-CM | POA: Diagnosis not present

## 2019-12-04 DIAGNOSIS — I10 Essential (primary) hypertension: Secondary | ICD-10-CM | POA: Diagnosis not present

## 2019-12-04 DIAGNOSIS — R4582 Worries: Secondary | ICD-10-CM | POA: Diagnosis not present

## 2019-12-04 DIAGNOSIS — J9611 Chronic respiratory failure with hypoxia: Secondary | ICD-10-CM | POA: Diagnosis not present

## 2019-12-10 DIAGNOSIS — Z23 Encounter for immunization: Secondary | ICD-10-CM | POA: Diagnosis not present

## 2020-01-01 DIAGNOSIS — E7849 Other hyperlipidemia: Secondary | ICD-10-CM | POA: Diagnosis not present

## 2020-01-01 DIAGNOSIS — I10 Essential (primary) hypertension: Secondary | ICD-10-CM | POA: Diagnosis not present

## 2020-01-01 DIAGNOSIS — E039 Hypothyroidism, unspecified: Secondary | ICD-10-CM | POA: Diagnosis not present

## 2020-02-01 DIAGNOSIS — E039 Hypothyroidism, unspecified: Secondary | ICD-10-CM | POA: Diagnosis not present

## 2020-02-01 DIAGNOSIS — E7849 Other hyperlipidemia: Secondary | ICD-10-CM | POA: Diagnosis not present

## 2020-02-01 DIAGNOSIS — I10 Essential (primary) hypertension: Secondary | ICD-10-CM | POA: Diagnosis not present

## 2020-03-14 DIAGNOSIS — E038 Other specified hypothyroidism: Secondary | ICD-10-CM | POA: Diagnosis not present

## 2020-03-14 DIAGNOSIS — R4582 Worries: Secondary | ICD-10-CM | POA: Diagnosis not present

## 2020-03-14 DIAGNOSIS — J449 Chronic obstructive pulmonary disease, unspecified: Secondary | ICD-10-CM | POA: Diagnosis not present

## 2020-03-14 DIAGNOSIS — I10 Essential (primary) hypertension: Secondary | ICD-10-CM | POA: Diagnosis not present

## 2020-03-14 DIAGNOSIS — E7849 Other hyperlipidemia: Secondary | ICD-10-CM | POA: Diagnosis not present

## 2020-03-14 DIAGNOSIS — J9611 Chronic respiratory failure with hypoxia: Secondary | ICD-10-CM | POA: Diagnosis not present

## 2020-03-14 DIAGNOSIS — Z79891 Long term (current) use of opiate analgesic: Secondary | ICD-10-CM | POA: Diagnosis not present

## 2020-03-14 DIAGNOSIS — F331 Major depressive disorder, recurrent, moderate: Secondary | ICD-10-CM | POA: Diagnosis not present

## 2020-03-22 DIAGNOSIS — J441 Chronic obstructive pulmonary disease with (acute) exacerbation: Secondary | ICD-10-CM | POA: Diagnosis not present

## 2020-04-09 DIAGNOSIS — J431 Panlobular emphysema: Secondary | ICD-10-CM | POA: Diagnosis not present

## 2020-04-09 DIAGNOSIS — J9611 Chronic respiratory failure with hypoxia: Secondary | ICD-10-CM | POA: Diagnosis not present

## 2020-04-09 DIAGNOSIS — K56609 Unspecified intestinal obstruction, unspecified as to partial versus complete obstruction: Secondary | ICD-10-CM | POA: Diagnosis not present

## 2020-04-09 DIAGNOSIS — N179 Acute kidney failure, unspecified: Secondary | ICD-10-CM | POA: Diagnosis not present

## 2020-04-09 DIAGNOSIS — Z9981 Dependence on supplemental oxygen: Secondary | ICD-10-CM | POA: Diagnosis not present

## 2020-04-09 DIAGNOSIS — J9811 Atelectasis: Secondary | ICD-10-CM | POA: Diagnosis not present

## 2020-04-09 DIAGNOSIS — R109 Unspecified abdominal pain: Secondary | ICD-10-CM | POA: Diagnosis not present

## 2020-04-09 DIAGNOSIS — Z20822 Contact with and (suspected) exposure to covid-19: Secondary | ICD-10-CM | POA: Diagnosis not present

## 2020-04-09 DIAGNOSIS — Z9889 Other specified postprocedural states: Secondary | ICD-10-CM | POA: Diagnosis not present

## 2020-04-09 DIAGNOSIS — K5651 Intestinal adhesions [bands], with partial obstruction: Secondary | ICD-10-CM | POA: Diagnosis not present

## 2020-04-09 DIAGNOSIS — A419 Sepsis, unspecified organism: Secondary | ICD-10-CM | POA: Diagnosis not present

## 2020-04-09 DIAGNOSIS — Z4682 Encounter for fitting and adjustment of non-vascular catheter: Secondary | ICD-10-CM | POA: Diagnosis not present

## 2020-04-09 DIAGNOSIS — K56600 Partial intestinal obstruction, unspecified as to cause: Secondary | ICD-10-CM | POA: Diagnosis not present

## 2020-04-09 DIAGNOSIS — F331 Major depressive disorder, recurrent, moderate: Secondary | ICD-10-CM | POA: Diagnosis not present

## 2020-04-10 DIAGNOSIS — T402X5A Adverse effect of other opioids, initial encounter: Secondary | ICD-10-CM | POA: Diagnosis present

## 2020-04-10 DIAGNOSIS — I739 Peripheral vascular disease, unspecified: Secondary | ICD-10-CM | POA: Diagnosis present

## 2020-04-10 DIAGNOSIS — Z7952 Long term (current) use of systemic steroids: Secondary | ICD-10-CM | POA: Diagnosis not present

## 2020-04-10 DIAGNOSIS — Z20822 Contact with and (suspected) exposure to covid-19: Secondary | ICD-10-CM | POA: Diagnosis present

## 2020-04-10 DIAGNOSIS — Z7982 Long term (current) use of aspirin: Secondary | ICD-10-CM | POA: Diagnosis not present

## 2020-04-10 DIAGNOSIS — F331 Major depressive disorder, recurrent, moderate: Secondary | ICD-10-CM | POA: Diagnosis present

## 2020-04-10 DIAGNOSIS — E669 Obesity, unspecified: Secondary | ICD-10-CM | POA: Diagnosis present

## 2020-04-10 DIAGNOSIS — K5903 Drug induced constipation: Secondary | ICD-10-CM | POA: Diagnosis present

## 2020-04-10 DIAGNOSIS — K6389 Other specified diseases of intestine: Secondary | ICD-10-CM | POA: Diagnosis not present

## 2020-04-10 DIAGNOSIS — Z9981 Dependence on supplemental oxygen: Secondary | ICD-10-CM | POA: Diagnosis not present

## 2020-04-10 DIAGNOSIS — R109 Unspecified abdominal pain: Secondary | ICD-10-CM | POA: Diagnosis not present

## 2020-04-10 DIAGNOSIS — I10 Essential (primary) hypertension: Secondary | ICD-10-CM | POA: Diagnosis present

## 2020-04-10 DIAGNOSIS — E860 Dehydration: Secondary | ICD-10-CM | POA: Diagnosis present

## 2020-04-10 DIAGNOSIS — K5651 Intestinal adhesions [bands], with partial obstruction: Secondary | ICD-10-CM | POA: Diagnosis present

## 2020-04-10 DIAGNOSIS — Z8709 Personal history of other diseases of the respiratory system: Secondary | ICD-10-CM | POA: Diagnosis not present

## 2020-04-10 DIAGNOSIS — J9611 Chronic respiratory failure with hypoxia: Secondary | ICD-10-CM | POA: Diagnosis present

## 2020-04-10 DIAGNOSIS — J431 Panlobular emphysema: Secondary | ICD-10-CM | POA: Diagnosis present

## 2020-04-10 DIAGNOSIS — R0602 Shortness of breath: Secondary | ICD-10-CM | POA: Diagnosis not present

## 2020-04-10 DIAGNOSIS — J9811 Atelectasis: Secondary | ICD-10-CM | POA: Diagnosis not present

## 2020-04-10 DIAGNOSIS — K56600 Partial intestinal obstruction, unspecified as to cause: Secondary | ICD-10-CM | POA: Diagnosis not present

## 2020-04-10 DIAGNOSIS — K219 Gastro-esophageal reflux disease without esophagitis: Secondary | ICD-10-CM | POA: Diagnosis present

## 2020-04-10 DIAGNOSIS — Z888 Allergy status to other drugs, medicaments and biological substances status: Secondary | ICD-10-CM | POA: Diagnosis not present

## 2020-04-10 DIAGNOSIS — K8020 Calculus of gallbladder without cholecystitis without obstruction: Secondary | ICD-10-CM | POA: Diagnosis present

## 2020-04-10 DIAGNOSIS — E782 Mixed hyperlipidemia: Secondary | ICD-10-CM | POA: Diagnosis present

## 2020-04-10 DIAGNOSIS — M5136 Other intervertebral disc degeneration, lumbar region: Secondary | ICD-10-CM | POA: Diagnosis present

## 2020-04-10 DIAGNOSIS — K828 Other specified diseases of gallbladder: Secondary | ICD-10-CM | POA: Diagnosis not present

## 2020-04-10 DIAGNOSIS — K56609 Unspecified intestinal obstruction, unspecified as to partial versus complete obstruction: Secondary | ICD-10-CM | POA: Diagnosis not present

## 2020-04-10 DIAGNOSIS — K56699 Other intestinal obstruction unspecified as to partial versus complete obstruction: Secondary | ICD-10-CM | POA: Diagnosis not present

## 2020-04-10 DIAGNOSIS — F419 Anxiety disorder, unspecified: Secondary | ICD-10-CM | POA: Diagnosis present

## 2020-04-10 DIAGNOSIS — N179 Acute kidney failure, unspecified: Secondary | ICD-10-CM | POA: Diagnosis not present

## 2020-04-10 DIAGNOSIS — I700 Atherosclerosis of aorta: Secondary | ICD-10-CM | POA: Diagnosis present

## 2020-04-10 DIAGNOSIS — N281 Cyst of kidney, acquired: Secondary | ICD-10-CM | POA: Diagnosis not present

## 2020-04-10 DIAGNOSIS — Z886 Allergy status to analgesic agent status: Secondary | ICD-10-CM | POA: Diagnosis not present

## 2020-04-13 DIAGNOSIS — R109 Unspecified abdominal pain: Secondary | ICD-10-CM | POA: Diagnosis not present

## 2020-04-13 DIAGNOSIS — R000 Tachycardia, unspecified: Secondary | ICD-10-CM | POA: Diagnosis not present

## 2020-04-13 DIAGNOSIS — J9611 Chronic respiratory failure with hypoxia: Secondary | ICD-10-CM | POA: Diagnosis present

## 2020-04-13 DIAGNOSIS — N179 Acute kidney failure, unspecified: Secondary | ICD-10-CM | POA: Diagnosis present

## 2020-04-13 DIAGNOSIS — E782 Mixed hyperlipidemia: Secondary | ICD-10-CM | POA: Diagnosis present

## 2020-04-13 DIAGNOSIS — I5189 Other ill-defined heart diseases: Secondary | ICD-10-CM | POA: Diagnosis not present

## 2020-04-13 DIAGNOSIS — K56609 Unspecified intestinal obstruction, unspecified as to partial versus complete obstruction: Secondary | ICD-10-CM | POA: Diagnosis present

## 2020-04-13 DIAGNOSIS — R0902 Hypoxemia: Secondary | ICD-10-CM | POA: Diagnosis not present

## 2020-04-13 DIAGNOSIS — J431 Panlobular emphysema: Secondary | ICD-10-CM | POA: Diagnosis present

## 2020-04-13 DIAGNOSIS — K219 Gastro-esophageal reflux disease without esophagitis: Secondary | ICD-10-CM | POA: Diagnosis not present

## 2020-04-13 DIAGNOSIS — R0602 Shortness of breath: Secondary | ICD-10-CM | POA: Diagnosis not present

## 2020-04-13 DIAGNOSIS — F410 Panic disorder [episodic paroxysmal anxiety] without agoraphobia: Secondary | ICD-10-CM | POA: Diagnosis present

## 2020-04-13 DIAGNOSIS — I499 Cardiac arrhythmia, unspecified: Secondary | ICD-10-CM | POA: Diagnosis not present

## 2020-04-13 DIAGNOSIS — M5136 Other intervertebral disc degeneration, lumbar region: Secondary | ICD-10-CM | POA: Diagnosis present

## 2020-04-13 DIAGNOSIS — Z79899 Other long term (current) drug therapy: Secondary | ICD-10-CM | POA: Diagnosis not present

## 2020-04-13 DIAGNOSIS — J449 Chronic obstructive pulmonary disease, unspecified: Secondary | ICD-10-CM | POA: Diagnosis not present

## 2020-04-13 DIAGNOSIS — I739 Peripheral vascular disease, unspecified: Secondary | ICD-10-CM | POA: Diagnosis not present

## 2020-04-13 DIAGNOSIS — K5669 Other partial intestinal obstruction: Secondary | ICD-10-CM | POA: Diagnosis not present

## 2020-04-13 DIAGNOSIS — Z886 Allergy status to analgesic agent status: Secondary | ICD-10-CM | POA: Diagnosis not present

## 2020-04-13 DIAGNOSIS — Z79891 Long term (current) use of opiate analgesic: Secondary | ICD-10-CM | POA: Diagnosis not present

## 2020-04-13 DIAGNOSIS — J439 Emphysema, unspecified: Secondary | ICD-10-CM | POA: Diagnosis not present

## 2020-04-13 DIAGNOSIS — Z7982 Long term (current) use of aspirin: Secondary | ICD-10-CM | POA: Diagnosis not present

## 2020-04-13 DIAGNOSIS — E785 Hyperlipidemia, unspecified: Secondary | ICD-10-CM | POA: Diagnosis not present

## 2020-04-13 DIAGNOSIS — R1032 Left lower quadrant pain: Secondary | ICD-10-CM | POA: Diagnosis not present

## 2020-04-13 DIAGNOSIS — K828 Other specified diseases of gallbladder: Secondary | ICD-10-CM | POA: Diagnosis not present

## 2020-04-13 DIAGNOSIS — Z87891 Personal history of nicotine dependence: Secondary | ICD-10-CM | POA: Diagnosis not present

## 2020-04-13 DIAGNOSIS — F32A Depression, unspecified: Secondary | ICD-10-CM | POA: Diagnosis not present

## 2020-04-13 DIAGNOSIS — Z7952 Long term (current) use of systemic steroids: Secondary | ICD-10-CM | POA: Diagnosis not present

## 2020-04-13 DIAGNOSIS — F331 Major depressive disorder, recurrent, moderate: Secondary | ICD-10-CM | POA: Diagnosis present

## 2020-04-13 DIAGNOSIS — R140 Abdominal distension (gaseous): Secondary | ICD-10-CM | POA: Diagnosis not present

## 2020-04-13 DIAGNOSIS — I10 Essential (primary) hypertension: Secondary | ICD-10-CM | POA: Diagnosis present

## 2020-04-13 DIAGNOSIS — M549 Dorsalgia, unspecified: Secondary | ICD-10-CM | POA: Diagnosis not present

## 2020-04-13 DIAGNOSIS — Z4682 Encounter for fitting and adjustment of non-vascular catheter: Secondary | ICD-10-CM | POA: Diagnosis not present

## 2020-04-13 DIAGNOSIS — R509 Fever, unspecified: Secondary | ICD-10-CM | POA: Diagnosis not present

## 2020-04-13 DIAGNOSIS — Z888 Allergy status to other drugs, medicaments and biological substances status: Secondary | ICD-10-CM | POA: Diagnosis not present

## 2020-04-30 DIAGNOSIS — I10 Essential (primary) hypertension: Secondary | ICD-10-CM | POA: Diagnosis not present

## 2020-04-30 DIAGNOSIS — E039 Hypothyroidism, unspecified: Secondary | ICD-10-CM | POA: Diagnosis not present

## 2020-04-30 DIAGNOSIS — E7849 Other hyperlipidemia: Secondary | ICD-10-CM | POA: Diagnosis not present

## 2020-05-21 DIAGNOSIS — K56609 Unspecified intestinal obstruction, unspecified as to partial versus complete obstruction: Secondary | ICD-10-CM | POA: Diagnosis not present

## 2020-05-21 DIAGNOSIS — Z6834 Body mass index (BMI) 34.0-34.9, adult: Secondary | ICD-10-CM | POA: Diagnosis not present

## 2020-05-22 DIAGNOSIS — Z23 Encounter for immunization: Secondary | ICD-10-CM | POA: Diagnosis not present

## 2020-05-31 DIAGNOSIS — E039 Hypothyroidism, unspecified: Secondary | ICD-10-CM | POA: Diagnosis not present

## 2020-05-31 DIAGNOSIS — E7849 Other hyperlipidemia: Secondary | ICD-10-CM | POA: Diagnosis not present

## 2020-05-31 DIAGNOSIS — I10 Essential (primary) hypertension: Secondary | ICD-10-CM | POA: Diagnosis not present

## 2020-06-25 DIAGNOSIS — E038 Other specified hypothyroidism: Secondary | ICD-10-CM | POA: Diagnosis not present

## 2020-06-25 DIAGNOSIS — R7303 Prediabetes: Secondary | ICD-10-CM | POA: Diagnosis not present

## 2020-06-25 DIAGNOSIS — R972 Elevated prostate specific antigen [PSA]: Secondary | ICD-10-CM | POA: Diagnosis not present

## 2020-06-25 DIAGNOSIS — R42 Dizziness and giddiness: Secondary | ICD-10-CM | POA: Diagnosis not present

## 2020-06-25 DIAGNOSIS — E782 Mixed hyperlipidemia: Secondary | ICD-10-CM | POA: Diagnosis not present

## 2020-06-27 DIAGNOSIS — E663 Overweight: Secondary | ICD-10-CM | POA: Diagnosis not present

## 2020-06-27 DIAGNOSIS — E7849 Other hyperlipidemia: Secondary | ICD-10-CM | POA: Diagnosis not present

## 2020-06-27 DIAGNOSIS — J449 Chronic obstructive pulmonary disease, unspecified: Secondary | ICD-10-CM | POA: Diagnosis not present

## 2020-06-27 DIAGNOSIS — Z7189 Other specified counseling: Secondary | ICD-10-CM | POA: Diagnosis not present

## 2020-06-27 DIAGNOSIS — J9611 Chronic respiratory failure with hypoxia: Secondary | ICD-10-CM | POA: Diagnosis not present

## 2020-06-27 DIAGNOSIS — Z79891 Long term (current) use of opiate analgesic: Secondary | ICD-10-CM | POA: Diagnosis not present

## 2020-06-27 DIAGNOSIS — I739 Peripheral vascular disease, unspecified: Secondary | ICD-10-CM | POA: Diagnosis not present

## 2020-06-27 DIAGNOSIS — I10 Essential (primary) hypertension: Secondary | ICD-10-CM | POA: Diagnosis not present

## 2020-06-30 DIAGNOSIS — E039 Hypothyroidism, unspecified: Secondary | ICD-10-CM | POA: Diagnosis not present

## 2020-06-30 DIAGNOSIS — I10 Essential (primary) hypertension: Secondary | ICD-10-CM | POA: Diagnosis not present

## 2020-06-30 DIAGNOSIS — E7849 Other hyperlipidemia: Secondary | ICD-10-CM | POA: Diagnosis not present

## 2020-07-08 DIAGNOSIS — J9611 Chronic respiratory failure with hypoxia: Secondary | ICD-10-CM | POA: Diagnosis not present

## 2020-07-08 DIAGNOSIS — J441 Chronic obstructive pulmonary disease with (acute) exacerbation: Secondary | ICD-10-CM | POA: Diagnosis not present

## 2020-07-31 DIAGNOSIS — E7849 Other hyperlipidemia: Secondary | ICD-10-CM | POA: Diagnosis not present

## 2020-07-31 DIAGNOSIS — E039 Hypothyroidism, unspecified: Secondary | ICD-10-CM | POA: Diagnosis not present

## 2020-07-31 DIAGNOSIS — I10 Essential (primary) hypertension: Secondary | ICD-10-CM | POA: Diagnosis not present

## 2020-08-31 DIAGNOSIS — I10 Essential (primary) hypertension: Secondary | ICD-10-CM | POA: Diagnosis not present

## 2020-08-31 DIAGNOSIS — E039 Hypothyroidism, unspecified: Secondary | ICD-10-CM | POA: Diagnosis not present

## 2020-08-31 DIAGNOSIS — E7849 Other hyperlipidemia: Secondary | ICD-10-CM | POA: Diagnosis not present

## 2020-09-22 DIAGNOSIS — F331 Major depressive disorder, recurrent, moderate: Secondary | ICD-10-CM | POA: Diagnosis not present

## 2020-09-22 DIAGNOSIS — C61 Malignant neoplasm of prostate: Secondary | ICD-10-CM | POA: Diagnosis not present

## 2020-09-22 DIAGNOSIS — J449 Chronic obstructive pulmonary disease, unspecified: Secondary | ICD-10-CM | POA: Diagnosis not present

## 2020-09-22 DIAGNOSIS — I10 Essential (primary) hypertension: Secondary | ICD-10-CM | POA: Diagnosis not present

## 2020-09-22 DIAGNOSIS — R4582 Worries: Secondary | ICD-10-CM | POA: Diagnosis not present

## 2020-09-22 DIAGNOSIS — E7849 Other hyperlipidemia: Secondary | ICD-10-CM | POA: Diagnosis not present

## 2020-09-22 DIAGNOSIS — J9611 Chronic respiratory failure with hypoxia: Secondary | ICD-10-CM | POA: Diagnosis not present

## 2020-09-22 DIAGNOSIS — I739 Peripheral vascular disease, unspecified: Secondary | ICD-10-CM | POA: Diagnosis not present

## 2020-10-01 DIAGNOSIS — I10 Essential (primary) hypertension: Secondary | ICD-10-CM | POA: Diagnosis not present

## 2020-10-01 DIAGNOSIS — E7849 Other hyperlipidemia: Secondary | ICD-10-CM | POA: Diagnosis not present

## 2020-10-01 DIAGNOSIS — E039 Hypothyroidism, unspecified: Secondary | ICD-10-CM | POA: Diagnosis not present

## 2020-10-31 DIAGNOSIS — J449 Chronic obstructive pulmonary disease, unspecified: Secondary | ICD-10-CM | POA: Diagnosis not present

## 2020-10-31 DIAGNOSIS — I739 Peripheral vascular disease, unspecified: Secondary | ICD-10-CM | POA: Diagnosis not present

## 2020-10-31 DIAGNOSIS — E039 Hypothyroidism, unspecified: Secondary | ICD-10-CM | POA: Diagnosis not present

## 2020-10-31 DIAGNOSIS — C61 Malignant neoplasm of prostate: Secondary | ICD-10-CM | POA: Diagnosis not present

## 2020-10-31 DIAGNOSIS — Z79891 Long term (current) use of opiate analgesic: Secondary | ICD-10-CM | POA: Diagnosis not present

## 2020-10-31 DIAGNOSIS — I10 Essential (primary) hypertension: Secondary | ICD-10-CM | POA: Diagnosis not present

## 2020-10-31 DIAGNOSIS — E7849 Other hyperlipidemia: Secondary | ICD-10-CM | POA: Diagnosis not present

## 2020-10-31 DIAGNOSIS — J9611 Chronic respiratory failure with hypoxia: Secondary | ICD-10-CM | POA: Diagnosis not present

## 2020-10-31 DIAGNOSIS — R4582 Worries: Secondary | ICD-10-CM | POA: Diagnosis not present

## 2020-11-17 DIAGNOSIS — Z23 Encounter for immunization: Secondary | ICD-10-CM | POA: Diagnosis not present

## 2020-12-01 DIAGNOSIS — E039 Hypothyroidism, unspecified: Secondary | ICD-10-CM | POA: Diagnosis not present

## 2020-12-01 DIAGNOSIS — I10 Essential (primary) hypertension: Secondary | ICD-10-CM | POA: Diagnosis not present

## 2020-12-01 DIAGNOSIS — E7849 Other hyperlipidemia: Secondary | ICD-10-CM | POA: Diagnosis not present

## 2020-12-09 DIAGNOSIS — R059 Cough, unspecified: Secondary | ICD-10-CM | POA: Diagnosis not present

## 2020-12-09 DIAGNOSIS — J441 Chronic obstructive pulmonary disease with (acute) exacerbation: Secondary | ICD-10-CM | POA: Diagnosis not present

## 2020-12-31 DIAGNOSIS — E7849 Other hyperlipidemia: Secondary | ICD-10-CM | POA: Diagnosis not present

## 2020-12-31 DIAGNOSIS — E039 Hypothyroidism, unspecified: Secondary | ICD-10-CM | POA: Diagnosis not present

## 2020-12-31 DIAGNOSIS — I10 Essential (primary) hypertension: Secondary | ICD-10-CM | POA: Diagnosis not present

## 2021-01-05 DIAGNOSIS — R5383 Other fatigue: Secondary | ICD-10-CM | POA: Diagnosis not present

## 2021-01-05 DIAGNOSIS — J440 Chronic obstructive pulmonary disease with acute lower respiratory infection: Secondary | ICD-10-CM | POA: Diagnosis not present

## 2021-01-05 DIAGNOSIS — N400 Enlarged prostate without lower urinary tract symptoms: Secondary | ICD-10-CM | POA: Diagnosis not present

## 2021-01-05 DIAGNOSIS — E038 Other specified hypothyroidism: Secondary | ICD-10-CM | POA: Diagnosis not present

## 2021-01-05 DIAGNOSIS — E7849 Other hyperlipidemia: Secondary | ICD-10-CM | POA: Diagnosis not present

## 2021-01-05 DIAGNOSIS — R739 Hyperglycemia, unspecified: Secondary | ICD-10-CM | POA: Diagnosis not present

## 2021-01-05 DIAGNOSIS — R972 Elevated prostate specific antigen [PSA]: Secondary | ICD-10-CM | POA: Diagnosis not present

## 2021-01-05 DIAGNOSIS — E782 Mixed hyperlipidemia: Secondary | ICD-10-CM | POA: Diagnosis not present

## 2021-01-09 DIAGNOSIS — R4582 Worries: Secondary | ICD-10-CM | POA: Diagnosis not present

## 2021-01-09 DIAGNOSIS — Z0001 Encounter for general adult medical examination with abnormal findings: Secondary | ICD-10-CM | POA: Diagnosis not present

## 2021-01-09 DIAGNOSIS — E7849 Other hyperlipidemia: Secondary | ICD-10-CM | POA: Diagnosis not present

## 2021-01-09 DIAGNOSIS — F331 Major depressive disorder, recurrent, moderate: Secondary | ICD-10-CM | POA: Diagnosis not present

## 2021-01-09 DIAGNOSIS — J9611 Chronic respiratory failure with hypoxia: Secondary | ICD-10-CM | POA: Diagnosis not present

## 2021-01-09 DIAGNOSIS — I10 Essential (primary) hypertension: Secondary | ICD-10-CM | POA: Diagnosis not present

## 2021-01-09 DIAGNOSIS — Z79891 Long term (current) use of opiate analgesic: Secondary | ICD-10-CM | POA: Diagnosis not present

## 2021-01-09 DIAGNOSIS — E663 Overweight: Secondary | ICD-10-CM | POA: Diagnosis not present

## 2021-01-30 DIAGNOSIS — I10 Essential (primary) hypertension: Secondary | ICD-10-CM | POA: Diagnosis not present

## 2021-01-30 DIAGNOSIS — E039 Hypothyroidism, unspecified: Secondary | ICD-10-CM | POA: Diagnosis not present

## 2021-01-30 DIAGNOSIS — E7849 Other hyperlipidemia: Secondary | ICD-10-CM | POA: Diagnosis not present

## 2021-02-19 DIAGNOSIS — J441 Chronic obstructive pulmonary disease with (acute) exacerbation: Secondary | ICD-10-CM | POA: Diagnosis not present

## 2021-03-01 DIAGNOSIS — E7849 Other hyperlipidemia: Secondary | ICD-10-CM | POA: Diagnosis not present

## 2021-03-01 DIAGNOSIS — I10 Essential (primary) hypertension: Secondary | ICD-10-CM | POA: Diagnosis not present

## 2021-03-26 DIAGNOSIS — E782 Mixed hyperlipidemia: Secondary | ICD-10-CM | POA: Diagnosis not present

## 2021-03-26 DIAGNOSIS — J440 Chronic obstructive pulmonary disease with acute lower respiratory infection: Secondary | ICD-10-CM | POA: Diagnosis not present

## 2021-03-26 DIAGNOSIS — K219 Gastro-esophageal reflux disease without esophagitis: Secondary | ICD-10-CM | POA: Diagnosis not present

## 2021-03-26 DIAGNOSIS — R739 Hyperglycemia, unspecified: Secondary | ICD-10-CM | POA: Diagnosis not present

## 2021-03-26 DIAGNOSIS — R972 Elevated prostate specific antigen [PSA]: Secondary | ICD-10-CM | POA: Diagnosis not present

## 2021-03-26 DIAGNOSIS — E7849 Other hyperlipidemia: Secondary | ICD-10-CM | POA: Diagnosis not present

## 2021-03-26 DIAGNOSIS — I10 Essential (primary) hypertension: Secondary | ICD-10-CM | POA: Diagnosis not present

## 2021-03-31 DIAGNOSIS — E7849 Other hyperlipidemia: Secondary | ICD-10-CM | POA: Diagnosis not present

## 2021-03-31 DIAGNOSIS — I10 Essential (primary) hypertension: Secondary | ICD-10-CM | POA: Diagnosis not present

## 2021-03-31 DIAGNOSIS — E039 Hypothyroidism, unspecified: Secondary | ICD-10-CM | POA: Diagnosis not present

## 2021-04-02 DIAGNOSIS — E663 Overweight: Secondary | ICD-10-CM | POA: Diagnosis not present

## 2021-04-02 DIAGNOSIS — I10 Essential (primary) hypertension: Secondary | ICD-10-CM | POA: Diagnosis not present

## 2021-04-02 DIAGNOSIS — R4582 Worries: Secondary | ICD-10-CM | POA: Diagnosis not present

## 2021-04-02 DIAGNOSIS — F331 Major depressive disorder, recurrent, moderate: Secondary | ICD-10-CM | POA: Diagnosis not present

## 2021-04-02 DIAGNOSIS — C61 Malignant neoplasm of prostate: Secondary | ICD-10-CM | POA: Diagnosis not present

## 2021-04-02 DIAGNOSIS — E038 Other specified hypothyroidism: Secondary | ICD-10-CM | POA: Diagnosis not present

## 2021-04-02 DIAGNOSIS — E7849 Other hyperlipidemia: Secondary | ICD-10-CM | POA: Diagnosis not present

## 2021-04-02 DIAGNOSIS — J9611 Chronic respiratory failure with hypoxia: Secondary | ICD-10-CM | POA: Diagnosis not present

## 2021-04-17 DIAGNOSIS — J440 Chronic obstructive pulmonary disease with acute lower respiratory infection: Secondary | ICD-10-CM | POA: Diagnosis not present

## 2021-05-01 DIAGNOSIS — E039 Hypothyroidism, unspecified: Secondary | ICD-10-CM | POA: Diagnosis not present

## 2021-05-01 DIAGNOSIS — I10 Essential (primary) hypertension: Secondary | ICD-10-CM | POA: Diagnosis not present

## 2021-05-01 DIAGNOSIS — E7849 Other hyperlipidemia: Secondary | ICD-10-CM | POA: Diagnosis not present

## 2021-07-01 DIAGNOSIS — E7849 Other hyperlipidemia: Secondary | ICD-10-CM | POA: Diagnosis not present

## 2021-07-01 DIAGNOSIS — E039 Hypothyroidism, unspecified: Secondary | ICD-10-CM | POA: Diagnosis not present

## 2021-07-01 DIAGNOSIS — I10 Essential (primary) hypertension: Secondary | ICD-10-CM | POA: Diagnosis not present

## 2021-07-03 DIAGNOSIS — J441 Chronic obstructive pulmonary disease with (acute) exacerbation: Secondary | ICD-10-CM | POA: Diagnosis not present

## 2021-07-03 DIAGNOSIS — J9611 Chronic respiratory failure with hypoxia: Secondary | ICD-10-CM | POA: Diagnosis not present

## 2021-07-20 DIAGNOSIS — R4582 Worries: Secondary | ICD-10-CM | POA: Diagnosis not present

## 2021-07-20 DIAGNOSIS — E7849 Other hyperlipidemia: Secondary | ICD-10-CM | POA: Diagnosis not present

## 2021-07-20 DIAGNOSIS — C61 Malignant neoplasm of prostate: Secondary | ICD-10-CM | POA: Diagnosis not present

## 2021-07-20 DIAGNOSIS — G6289 Other specified polyneuropathies: Secondary | ICD-10-CM | POA: Diagnosis not present

## 2021-07-20 DIAGNOSIS — M545 Low back pain, unspecified: Secondary | ICD-10-CM | POA: Diagnosis not present

## 2021-07-20 DIAGNOSIS — I10 Essential (primary) hypertension: Secondary | ICD-10-CM | POA: Diagnosis not present

## 2021-07-20 DIAGNOSIS — E663 Overweight: Secondary | ICD-10-CM | POA: Diagnosis not present

## 2021-07-20 DIAGNOSIS — Z79891 Long term (current) use of opiate analgesic: Secondary | ICD-10-CM | POA: Diagnosis not present

## 2021-07-20 DIAGNOSIS — I739 Peripheral vascular disease, unspecified: Secondary | ICD-10-CM | POA: Diagnosis not present

## 2021-07-20 DIAGNOSIS — J9611 Chronic respiratory failure with hypoxia: Secondary | ICD-10-CM | POA: Diagnosis not present

## 2021-07-20 DIAGNOSIS — J449 Chronic obstructive pulmonary disease, unspecified: Secondary | ICD-10-CM | POA: Diagnosis not present

## 2021-07-20 DIAGNOSIS — E038 Other specified hypothyroidism: Secondary | ICD-10-CM | POA: Diagnosis not present

## 2021-07-31 DIAGNOSIS — E039 Hypothyroidism, unspecified: Secondary | ICD-10-CM | POA: Diagnosis not present

## 2021-07-31 DIAGNOSIS — E7849 Other hyperlipidemia: Secondary | ICD-10-CM | POA: Diagnosis not present

## 2021-08-31 DIAGNOSIS — E7849 Other hyperlipidemia: Secondary | ICD-10-CM | POA: Diagnosis not present

## 2021-08-31 DIAGNOSIS — I10 Essential (primary) hypertension: Secondary | ICD-10-CM | POA: Diagnosis not present

## 2021-08-31 DIAGNOSIS — E039 Hypothyroidism, unspecified: Secondary | ICD-10-CM | POA: Diagnosis not present

## 2021-10-01 DIAGNOSIS — J449 Chronic obstructive pulmonary disease, unspecified: Secondary | ICD-10-CM | POA: Diagnosis not present

## 2021-10-01 DIAGNOSIS — I10 Essential (primary) hypertension: Secondary | ICD-10-CM | POA: Diagnosis not present

## 2021-10-14 DIAGNOSIS — I10 Essential (primary) hypertension: Secondary | ICD-10-CM | POA: Diagnosis not present

## 2021-10-14 DIAGNOSIS — F331 Major depressive disorder, recurrent, moderate: Secondary | ICD-10-CM | POA: Diagnosis not present

## 2021-10-14 DIAGNOSIS — J449 Chronic obstructive pulmonary disease, unspecified: Secondary | ICD-10-CM | POA: Diagnosis not present

## 2021-10-14 DIAGNOSIS — I739 Peripheral vascular disease, unspecified: Secondary | ICD-10-CM | POA: Diagnosis not present

## 2021-10-14 DIAGNOSIS — E7849 Other hyperlipidemia: Secondary | ICD-10-CM | POA: Diagnosis not present

## 2021-10-14 DIAGNOSIS — E038 Other specified hypothyroidism: Secondary | ICD-10-CM | POA: Diagnosis not present

## 2021-10-14 DIAGNOSIS — Z79891 Long term (current) use of opiate analgesic: Secondary | ICD-10-CM | POA: Diagnosis not present

## 2021-10-14 DIAGNOSIS — G6289 Other specified polyneuropathies: Secondary | ICD-10-CM | POA: Diagnosis not present

## 2021-10-14 DIAGNOSIS — E663 Overweight: Secondary | ICD-10-CM | POA: Diagnosis not present

## 2021-10-14 DIAGNOSIS — J9611 Chronic respiratory failure with hypoxia: Secondary | ICD-10-CM | POA: Diagnosis not present

## 2021-10-14 DIAGNOSIS — R4582 Worries: Secondary | ICD-10-CM | POA: Diagnosis not present

## 2021-10-14 DIAGNOSIS — C61 Malignant neoplasm of prostate: Secondary | ICD-10-CM | POA: Diagnosis not present

## 2021-10-19 DIAGNOSIS — Z23 Encounter for immunization: Secondary | ICD-10-CM | POA: Diagnosis not present

## 2021-10-31 DIAGNOSIS — I10 Essential (primary) hypertension: Secondary | ICD-10-CM | POA: Diagnosis not present

## 2021-10-31 DIAGNOSIS — J449 Chronic obstructive pulmonary disease, unspecified: Secondary | ICD-10-CM | POA: Diagnosis not present

## 2022-01-04 DIAGNOSIS — J441 Chronic obstructive pulmonary disease with (acute) exacerbation: Secondary | ICD-10-CM | POA: Diagnosis not present

## 2022-01-07 DIAGNOSIS — I10 Essential (primary) hypertension: Secondary | ICD-10-CM | POA: Diagnosis not present

## 2022-01-07 DIAGNOSIS — Z9189 Other specified personal risk factors, not elsewhere classified: Secondary | ICD-10-CM | POA: Diagnosis not present

## 2022-01-07 DIAGNOSIS — G6289 Other specified polyneuropathies: Secondary | ICD-10-CM | POA: Diagnosis not present

## 2022-01-07 DIAGNOSIS — Z1212 Encounter for screening for malignant neoplasm of rectum: Secondary | ICD-10-CM | POA: Diagnosis not present

## 2022-01-07 DIAGNOSIS — E7849 Other hyperlipidemia: Secondary | ICD-10-CM | POA: Diagnosis not present

## 2022-01-07 DIAGNOSIS — Z23 Encounter for immunization: Secondary | ICD-10-CM | POA: Diagnosis not present

## 2022-01-07 DIAGNOSIS — F331 Major depressive disorder, recurrent, moderate: Secondary | ICD-10-CM | POA: Diagnosis not present

## 2022-01-07 DIAGNOSIS — J301 Allergic rhinitis due to pollen: Secondary | ICD-10-CM | POA: Diagnosis not present

## 2022-01-07 DIAGNOSIS — M545 Low back pain, unspecified: Secondary | ICD-10-CM | POA: Diagnosis not present

## 2022-01-07 DIAGNOSIS — I739 Peripheral vascular disease, unspecified: Secondary | ICD-10-CM | POA: Diagnosis not present

## 2022-01-07 DIAGNOSIS — J449 Chronic obstructive pulmonary disease, unspecified: Secondary | ICD-10-CM | POA: Diagnosis not present

## 2022-01-07 DIAGNOSIS — C61 Malignant neoplasm of prostate: Secondary | ICD-10-CM | POA: Diagnosis not present

## 2022-01-07 DIAGNOSIS — J9611 Chronic respiratory failure with hypoxia: Secondary | ICD-10-CM | POA: Diagnosis not present

## 2022-01-07 DIAGNOSIS — E038 Other specified hypothyroidism: Secondary | ICD-10-CM | POA: Diagnosis not present

## 2022-01-07 DIAGNOSIS — Z0001 Encounter for general adult medical examination with abnormal findings: Secondary | ICD-10-CM | POA: Diagnosis not present

## 2022-01-07 DIAGNOSIS — E782 Mixed hyperlipidemia: Secondary | ICD-10-CM | POA: Diagnosis not present

## 2022-01-07 DIAGNOSIS — E663 Overweight: Secondary | ICD-10-CM | POA: Diagnosis not present

## 2022-04-08 DIAGNOSIS — C61 Malignant neoplasm of prostate: Secondary | ICD-10-CM | POA: Diagnosis not present

## 2022-04-08 DIAGNOSIS — N1831 Chronic kidney disease, stage 3a: Secondary | ICD-10-CM | POA: Diagnosis not present

## 2022-04-08 DIAGNOSIS — J449 Chronic obstructive pulmonary disease, unspecified: Secondary | ICD-10-CM | POA: Diagnosis not present

## 2022-04-08 DIAGNOSIS — I10 Essential (primary) hypertension: Secondary | ICD-10-CM | POA: Diagnosis not present

## 2022-04-08 DIAGNOSIS — R4582 Worries: Secondary | ICD-10-CM | POA: Diagnosis not present

## 2022-04-08 DIAGNOSIS — F331 Major depressive disorder, recurrent, moderate: Secondary | ICD-10-CM | POA: Diagnosis not present

## 2022-04-08 DIAGNOSIS — E663 Overweight: Secondary | ICD-10-CM | POA: Diagnosis not present

## 2022-04-08 DIAGNOSIS — E038 Other specified hypothyroidism: Secondary | ICD-10-CM | POA: Diagnosis not present

## 2022-04-08 DIAGNOSIS — I739 Peripheral vascular disease, unspecified: Secondary | ICD-10-CM | POA: Diagnosis not present

## 2022-04-08 DIAGNOSIS — E7849 Other hyperlipidemia: Secondary | ICD-10-CM | POA: Diagnosis not present

## 2022-04-08 DIAGNOSIS — Z79891 Long term (current) use of opiate analgesic: Secondary | ICD-10-CM | POA: Diagnosis not present

## 2022-04-08 DIAGNOSIS — J9611 Chronic respiratory failure with hypoxia: Secondary | ICD-10-CM | POA: Diagnosis not present

## 2022-07-05 DIAGNOSIS — J441 Chronic obstructive pulmonary disease with (acute) exacerbation: Secondary | ICD-10-CM | POA: Diagnosis not present

## 2022-07-12 DIAGNOSIS — E038 Other specified hypothyroidism: Secondary | ICD-10-CM | POA: Diagnosis not present

## 2022-07-12 DIAGNOSIS — J449 Chronic obstructive pulmonary disease, unspecified: Secondary | ICD-10-CM | POA: Diagnosis not present

## 2022-07-12 DIAGNOSIS — F331 Major depressive disorder, recurrent, moderate: Secondary | ICD-10-CM | POA: Diagnosis not present

## 2022-07-12 DIAGNOSIS — J9611 Chronic respiratory failure with hypoxia: Secondary | ICD-10-CM | POA: Diagnosis not present

## 2022-07-12 DIAGNOSIS — C61 Malignant neoplasm of prostate: Secondary | ICD-10-CM | POA: Diagnosis not present

## 2022-07-12 DIAGNOSIS — R4582 Worries: Secondary | ICD-10-CM | POA: Diagnosis not present

## 2022-07-12 DIAGNOSIS — I10 Essential (primary) hypertension: Secondary | ICD-10-CM | POA: Diagnosis not present

## 2022-07-12 DIAGNOSIS — Z79891 Long term (current) use of opiate analgesic: Secondary | ICD-10-CM | POA: Diagnosis not present

## 2022-07-12 DIAGNOSIS — I739 Peripheral vascular disease, unspecified: Secondary | ICD-10-CM | POA: Diagnosis not present

## 2022-07-12 DIAGNOSIS — E7849 Other hyperlipidemia: Secondary | ICD-10-CM | POA: Diagnosis not present

## 2022-07-12 DIAGNOSIS — N1831 Chronic kidney disease, stage 3a: Secondary | ICD-10-CM | POA: Diagnosis not present

## 2022-07-12 DIAGNOSIS — E663 Overweight: Secondary | ICD-10-CM | POA: Diagnosis not present

## 2022-10-02 DIAGNOSIS — U071 COVID-19: Secondary | ICD-10-CM | POA: Diagnosis not present

## 2022-10-08 DIAGNOSIS — U071 COVID-19: Secondary | ICD-10-CM | POA: Diagnosis not present

## 2022-10-08 DIAGNOSIS — J441 Chronic obstructive pulmonary disease with (acute) exacerbation: Secondary | ICD-10-CM | POA: Diagnosis not present

## 2022-10-12 DIAGNOSIS — J9611 Chronic respiratory failure with hypoxia: Secondary | ICD-10-CM | POA: Diagnosis not present

## 2022-10-12 DIAGNOSIS — C61 Malignant neoplasm of prostate: Secondary | ICD-10-CM | POA: Diagnosis not present

## 2022-10-12 DIAGNOSIS — G6289 Other specified polyneuropathies: Secondary | ICD-10-CM | POA: Diagnosis not present

## 2022-10-12 DIAGNOSIS — N1831 Chronic kidney disease, stage 3a: Secondary | ICD-10-CM | POA: Diagnosis not present

## 2022-10-12 DIAGNOSIS — I10 Essential (primary) hypertension: Secondary | ICD-10-CM | POA: Diagnosis not present

## 2022-10-12 DIAGNOSIS — E7849 Other hyperlipidemia: Secondary | ICD-10-CM | POA: Diagnosis not present

## 2022-10-12 DIAGNOSIS — J449 Chronic obstructive pulmonary disease, unspecified: Secondary | ICD-10-CM | POA: Diagnosis not present

## 2022-10-12 DIAGNOSIS — I739 Peripheral vascular disease, unspecified: Secondary | ICD-10-CM | POA: Diagnosis not present

## 2022-10-12 DIAGNOSIS — E038 Other specified hypothyroidism: Secondary | ICD-10-CM | POA: Diagnosis not present

## 2022-10-12 DIAGNOSIS — Z79891 Long term (current) use of opiate analgesic: Secondary | ICD-10-CM | POA: Diagnosis not present

## 2022-10-12 DIAGNOSIS — E663 Overweight: Secondary | ICD-10-CM | POA: Diagnosis not present

## 2022-10-12 DIAGNOSIS — R4582 Worries: Secondary | ICD-10-CM | POA: Diagnosis not present

## 2022-10-15 DIAGNOSIS — J449 Chronic obstructive pulmonary disease, unspecified: Secondary | ICD-10-CM | POA: Diagnosis not present

## 2022-10-21 DIAGNOSIS — J431 Panlobular emphysema: Secondary | ICD-10-CM | POA: Diagnosis not present

## 2022-10-21 DIAGNOSIS — I2510 Atherosclerotic heart disease of native coronary artery without angina pectoris: Secondary | ICD-10-CM | POA: Diagnosis not present

## 2022-10-21 DIAGNOSIS — R059 Cough, unspecified: Secondary | ICD-10-CM | POA: Diagnosis not present

## 2022-10-21 DIAGNOSIS — Z79899 Other long term (current) drug therapy: Secondary | ICD-10-CM | POA: Diagnosis not present

## 2022-10-21 DIAGNOSIS — R051 Acute cough: Secondary | ICD-10-CM | POA: Diagnosis not present

## 2022-10-21 DIAGNOSIS — R918 Other nonspecific abnormal finding of lung field: Secondary | ICD-10-CM | POA: Diagnosis not present

## 2022-10-21 DIAGNOSIS — I10 Essential (primary) hypertension: Secondary | ICD-10-CM | POA: Diagnosis not present

## 2022-10-21 DIAGNOSIS — R935 Abnormal findings on diagnostic imaging of other abdominal regions, including retroperitoneum: Secondary | ICD-10-CM | POA: Diagnosis not present

## 2022-10-21 DIAGNOSIS — E782 Mixed hyperlipidemia: Secondary | ICD-10-CM | POA: Diagnosis not present

## 2022-10-21 DIAGNOSIS — K8020 Calculus of gallbladder without cholecystitis without obstruction: Secondary | ICD-10-CM | POA: Diagnosis not present

## 2022-10-21 DIAGNOSIS — R0602 Shortness of breath: Secondary | ICD-10-CM | POA: Diagnosis not present

## 2022-10-21 DIAGNOSIS — Z87891 Personal history of nicotine dependence: Secondary | ICD-10-CM | POA: Diagnosis not present

## 2022-10-21 DIAGNOSIS — Z886 Allergy status to analgesic agent status: Secondary | ICD-10-CM | POA: Diagnosis not present

## 2022-10-22 DIAGNOSIS — Z6833 Body mass index (BMI) 33.0-33.9, adult: Secondary | ICD-10-CM | POA: Diagnosis not present

## 2022-10-22 DIAGNOSIS — J440 Chronic obstructive pulmonary disease with acute lower respiratory infection: Secondary | ICD-10-CM | POA: Diagnosis not present

## 2022-10-22 DIAGNOSIS — F410 Panic disorder [episodic paroxysmal anxiety] without agoraphobia: Secondary | ICD-10-CM | POA: Diagnosis not present

## 2022-10-29 DIAGNOSIS — R0689 Other abnormalities of breathing: Secondary | ICD-10-CM | POA: Diagnosis not present

## 2022-10-29 DIAGNOSIS — I743 Embolism and thrombosis of arteries of the lower extremities: Secondary | ICD-10-CM | POA: Diagnosis not present

## 2022-10-29 DIAGNOSIS — R29818 Other symptoms and signs involving the nervous system: Secondary | ICD-10-CM | POA: Diagnosis not present

## 2022-10-29 DIAGNOSIS — J810 Acute pulmonary edema: Secondary | ICD-10-CM | POA: Diagnosis not present

## 2022-10-29 DIAGNOSIS — Z8616 Personal history of COVID-19: Secondary | ICD-10-CM | POA: Diagnosis not present

## 2022-10-29 DIAGNOSIS — J9601 Acute respiratory failure with hypoxia: Secondary | ICD-10-CM | POA: Diagnosis present

## 2022-10-29 DIAGNOSIS — J9811 Atelectasis: Secondary | ICD-10-CM | POA: Diagnosis not present

## 2022-10-29 DIAGNOSIS — I70201 Unspecified atherosclerosis of native arteries of extremities, right leg: Secondary | ICD-10-CM | POA: Diagnosis present

## 2022-10-29 DIAGNOSIS — Z8701 Personal history of pneumonia (recurrent): Secondary | ICD-10-CM | POA: Diagnosis not present

## 2022-10-29 DIAGNOSIS — E875 Hyperkalemia: Secondary | ICD-10-CM | POA: Diagnosis not present

## 2022-10-29 DIAGNOSIS — Z9989 Dependence on other enabling machines and devices: Secondary | ICD-10-CM | POA: Diagnosis not present

## 2022-10-29 DIAGNOSIS — N184 Chronic kidney disease, stage 4 (severe): Secondary | ICD-10-CM | POA: Diagnosis present

## 2022-10-29 DIAGNOSIS — R069 Unspecified abnormalities of breathing: Secondary | ICD-10-CM | POA: Diagnosis not present

## 2022-10-29 DIAGNOSIS — R000 Tachycardia, unspecified: Secondary | ICD-10-CM | POA: Diagnosis not present

## 2022-10-29 DIAGNOSIS — R0600 Dyspnea, unspecified: Secondary | ICD-10-CM | POA: Diagnosis not present

## 2022-10-29 DIAGNOSIS — Z0389 Encounter for observation for other suspected diseases and conditions ruled out: Secondary | ICD-10-CM | POA: Diagnosis not present

## 2022-10-29 DIAGNOSIS — Z888 Allergy status to other drugs, medicaments and biological substances status: Secondary | ICD-10-CM | POA: Diagnosis not present

## 2022-10-29 DIAGNOSIS — I129 Hypertensive chronic kidney disease with stage 1 through stage 4 chronic kidney disease, or unspecified chronic kidney disease: Secondary | ICD-10-CM | POA: Diagnosis present

## 2022-10-29 DIAGNOSIS — I10 Essential (primary) hypertension: Secondary | ICD-10-CM | POA: Diagnosis not present

## 2022-10-29 DIAGNOSIS — I7409 Other arterial embolism and thrombosis of abdominal aorta: Secondary | ICD-10-CM | POA: Diagnosis not present

## 2022-10-29 DIAGNOSIS — E872 Acidosis, unspecified: Secondary | ICD-10-CM | POA: Diagnosis present

## 2022-10-29 DIAGNOSIS — Z515 Encounter for palliative care: Secondary | ICD-10-CM | POA: Diagnosis not present

## 2022-10-29 DIAGNOSIS — N179 Acute kidney failure, unspecified: Secondary | ICD-10-CM | POA: Diagnosis not present

## 2022-10-29 DIAGNOSIS — R001 Bradycardia, unspecified: Secondary | ICD-10-CM | POA: Diagnosis not present

## 2022-10-29 DIAGNOSIS — R579 Shock, unspecified: Secondary | ICD-10-CM | POA: Diagnosis not present

## 2022-10-29 DIAGNOSIS — R570 Cardiogenic shock: Secondary | ICD-10-CM | POA: Diagnosis not present

## 2022-10-29 DIAGNOSIS — I97711 Intraoperative cardiac arrest during other surgery: Secondary | ICD-10-CM | POA: Diagnosis not present

## 2022-10-29 DIAGNOSIS — Z66 Do not resuscitate: Secondary | ICD-10-CM | POA: Diagnosis not present

## 2022-10-29 DIAGNOSIS — I2699 Other pulmonary embolism without acute cor pulmonale: Secondary | ICD-10-CM | POA: Diagnosis not present

## 2022-10-29 DIAGNOSIS — E785 Hyperlipidemia, unspecified: Secondary | ICD-10-CM | POA: Diagnosis present

## 2022-10-29 DIAGNOSIS — I700 Atherosclerosis of aorta: Secondary | ICD-10-CM | POA: Diagnosis not present

## 2022-10-29 DIAGNOSIS — Z9981 Dependence on supplemental oxygen: Secondary | ICD-10-CM | POA: Diagnosis not present

## 2022-10-29 DIAGNOSIS — I959 Hypotension, unspecified: Secondary | ICD-10-CM | POA: Diagnosis not present

## 2022-10-29 DIAGNOSIS — G9341 Metabolic encephalopathy: Secondary | ICD-10-CM | POA: Diagnosis present

## 2022-10-29 DIAGNOSIS — K8020 Calculus of gallbladder without cholecystitis without obstruction: Secondary | ICD-10-CM | POA: Diagnosis not present

## 2022-10-29 DIAGNOSIS — I2510 Atherosclerotic heart disease of native coronary artery without angina pectoris: Secondary | ICD-10-CM | POA: Diagnosis present

## 2022-10-29 DIAGNOSIS — R6511 Systemic inflammatory response syndrome (SIRS) of non-infectious origin with acute organ dysfunction: Secondary | ICD-10-CM | POA: Diagnosis present

## 2022-10-29 DIAGNOSIS — I7410 Embolism and thrombosis of unspecified parts of aorta: Secondary | ICD-10-CM | POA: Diagnosis not present

## 2022-10-29 DIAGNOSIS — J449 Chronic obstructive pulmonary disease, unspecified: Secondary | ICD-10-CM | POA: Diagnosis not present

## 2022-10-29 DIAGNOSIS — R197 Diarrhea, unspecified: Secondary | ICD-10-CM | POA: Diagnosis not present

## 2022-10-29 DIAGNOSIS — N19 Unspecified kidney failure: Secondary | ICD-10-CM | POA: Diagnosis not present

## 2022-10-30 DIAGNOSIS — R000 Tachycardia, unspecified: Secondary | ICD-10-CM | POA: Diagnosis not present

## 2022-10-30 DIAGNOSIS — R570 Cardiogenic shock: Secondary | ICD-10-CM | POA: Diagnosis present

## 2022-10-30 DIAGNOSIS — N184 Chronic kidney disease, stage 4 (severe): Secondary | ICD-10-CM | POA: Diagnosis present

## 2022-10-30 DIAGNOSIS — I700 Atherosclerosis of aorta: Secondary | ICD-10-CM | POA: Diagnosis not present

## 2022-10-30 DIAGNOSIS — J9601 Acute respiratory failure with hypoxia: Secondary | ICD-10-CM | POA: Diagnosis present

## 2022-10-30 DIAGNOSIS — R6511 Systemic inflammatory response syndrome (SIRS) of non-infectious origin with acute organ dysfunction: Secondary | ICD-10-CM | POA: Diagnosis present

## 2022-10-30 DIAGNOSIS — I70201 Unspecified atherosclerosis of native arteries of extremities, right leg: Secondary | ICD-10-CM | POA: Diagnosis present

## 2022-10-30 DIAGNOSIS — Z8701 Personal history of pneumonia (recurrent): Secondary | ICD-10-CM | POA: Diagnosis not present

## 2022-10-30 DIAGNOSIS — I2510 Atherosclerotic heart disease of native coronary artery without angina pectoris: Secondary | ICD-10-CM | POA: Diagnosis present

## 2022-10-30 DIAGNOSIS — R001 Bradycardia, unspecified: Secondary | ICD-10-CM | POA: Diagnosis not present

## 2022-10-30 DIAGNOSIS — Z515 Encounter for palliative care: Secondary | ICD-10-CM | POA: Diagnosis not present

## 2022-10-30 DIAGNOSIS — E872 Acidosis, unspecified: Secondary | ICD-10-CM | POA: Diagnosis present

## 2022-10-30 DIAGNOSIS — I129 Hypertensive chronic kidney disease with stage 1 through stage 4 chronic kidney disease, or unspecified chronic kidney disease: Secondary | ICD-10-CM | POA: Diagnosis present

## 2022-10-30 DIAGNOSIS — E785 Hyperlipidemia, unspecified: Secondary | ICD-10-CM | POA: Diagnosis present

## 2022-10-30 DIAGNOSIS — J9811 Atelectasis: Secondary | ICD-10-CM | POA: Diagnosis not present

## 2022-10-30 DIAGNOSIS — J810 Acute pulmonary edema: Secondary | ICD-10-CM | POA: Diagnosis not present

## 2022-10-30 DIAGNOSIS — G9341 Metabolic encephalopathy: Secondary | ICD-10-CM | POA: Diagnosis present

## 2022-10-30 DIAGNOSIS — J449 Chronic obstructive pulmonary disease, unspecified: Secondary | ICD-10-CM | POA: Diagnosis present

## 2022-10-30 DIAGNOSIS — E875 Hyperkalemia: Secondary | ICD-10-CM | POA: Diagnosis present

## 2022-10-30 DIAGNOSIS — I7409 Other arterial embolism and thrombosis of abdominal aorta: Secondary | ICD-10-CM | POA: Diagnosis present

## 2022-10-30 DIAGNOSIS — N179 Acute kidney failure, unspecified: Secondary | ICD-10-CM | POA: Diagnosis present

## 2022-10-30 DIAGNOSIS — I97711 Intraoperative cardiac arrest during other surgery: Secondary | ICD-10-CM | POA: Diagnosis not present

## 2022-10-30 DIAGNOSIS — Z9981 Dependence on supplemental oxygen: Secondary | ICD-10-CM | POA: Diagnosis not present

## 2022-10-30 DIAGNOSIS — I959 Hypotension, unspecified: Secondary | ICD-10-CM | POA: Diagnosis not present

## 2022-10-30 DIAGNOSIS — Z66 Do not resuscitate: Secondary | ICD-10-CM | POA: Diagnosis not present

## 2022-10-30 DIAGNOSIS — I743 Embolism and thrombosis of arteries of the lower extremities: Secondary | ICD-10-CM | POA: Diagnosis present

## 2022-10-30 DIAGNOSIS — Z8616 Personal history of COVID-19: Secondary | ICD-10-CM | POA: Diagnosis not present

## 2022-11-02 DEATH — deceased
